# Patient Record
Sex: Female | Born: 1950 | Race: Asian | Hispanic: No | Marital: Married | Smoking: Never smoker
Health system: Southern US, Community
[De-identification: ages and names within clinical notes are randomized; demographics above are authoritative.]

## PROBLEM LIST (undated history)

## (undated) DIAGNOSIS — I10 Essential (primary) hypertension: Secondary | ICD-10-CM

## (undated) DIAGNOSIS — K219 Gastro-esophageal reflux disease without esophagitis: Secondary | ICD-10-CM

## (undated) DIAGNOSIS — E079 Disorder of thyroid, unspecified: Secondary | ICD-10-CM

## (undated) DIAGNOSIS — T884XXA Failed or difficult intubation, initial encounter: Secondary | ICD-10-CM

## (undated) DIAGNOSIS — Z972 Presence of dental prosthetic device (complete) (partial): Secondary | ICD-10-CM

## (undated) DIAGNOSIS — E119 Type 2 diabetes mellitus without complications: Secondary | ICD-10-CM

## (undated) DIAGNOSIS — M1990 Unspecified osteoarthritis, unspecified site: Secondary | ICD-10-CM

## (undated) DIAGNOSIS — E785 Hyperlipidemia, unspecified: Secondary | ICD-10-CM

## (undated) DIAGNOSIS — E039 Hypothyroidism, unspecified: Secondary | ICD-10-CM

## (undated) HISTORY — DX: Disorder of thyroid, unspecified: E07.9

## (undated) HISTORY — DX: Type 2 diabetes mellitus without complications: E11.9

## (undated) HISTORY — DX: Hyperlipidemia, unspecified: E78.5

## (undated) HISTORY — PX: NO PAST SURGERIES: SHX2092

---

## 2004-09-23 ENCOUNTER — Emergency Department: Payer: Self-pay | Admitting: Emergency Medicine

## 2006-12-26 ENCOUNTER — Ambulatory Visit: Payer: Self-pay | Admitting: Internal Medicine

## 2011-11-01 ENCOUNTER — Ambulatory Visit: Payer: Self-pay | Admitting: Internal Medicine

## 2012-12-24 ENCOUNTER — Ambulatory Visit: Payer: Self-pay | Admitting: Family

## 2016-10-23 ENCOUNTER — Other Ambulatory Visit: Payer: Self-pay | Admitting: Internal Medicine

## 2016-10-23 DIAGNOSIS — R109 Unspecified abdominal pain: Secondary | ICD-10-CM

## 2016-10-24 DIAGNOSIS — I10 Essential (primary) hypertension: Secondary | ICD-10-CM | POA: Diagnosis not present

## 2016-10-24 DIAGNOSIS — E034 Atrophy of thyroid (acquired): Secondary | ICD-10-CM | POA: Diagnosis not present

## 2016-10-24 DIAGNOSIS — R5381 Other malaise: Secondary | ICD-10-CM | POA: Diagnosis not present

## 2016-10-24 DIAGNOSIS — E784 Other hyperlipidemia: Secondary | ICD-10-CM | POA: Diagnosis not present

## 2016-10-30 ENCOUNTER — Encounter: Payer: Self-pay | Admitting: *Deleted

## 2016-10-30 ENCOUNTER — Ambulatory Visit
Admission: RE | Admit: 2016-10-30 | Discharge: 2016-10-30 | Disposition: A | Discharge status: Home / Self Care | Payer: Medicare Other | Source: Ambulatory Visit | Attending: Internal Medicine | Admitting: Internal Medicine

## 2016-10-30 DIAGNOSIS — R079 Chest pain, unspecified: Secondary | ICD-10-CM | POA: Diagnosis not present

## 2016-10-30 DIAGNOSIS — K8020 Calculus of gallbladder without cholecystitis without obstruction: Secondary | ICD-10-CM | POA: Insufficient documentation

## 2016-10-30 DIAGNOSIS — R109 Unspecified abdominal pain: Secondary | ICD-10-CM | POA: Insufficient documentation

## 2016-10-30 DIAGNOSIS — K760 Fatty (change of) liver, not elsewhere classified: Secondary | ICD-10-CM | POA: Insufficient documentation

## 2016-10-30 DIAGNOSIS — K8000 Calculus of gallbladder with acute cholecystitis without obstruction: Secondary | ICD-10-CM | POA: Diagnosis not present

## 2016-10-31 ENCOUNTER — Ambulatory Visit (INDEPENDENT_AMBULATORY_CARE_PROVIDER_SITE_OTHER): Payer: Medicare Other | Admitting: General Surgery

## 2016-10-31 ENCOUNTER — Encounter: Payer: Self-pay | Admitting: General Surgery

## 2016-10-31 VITALS — BP 144/66 | HR 62 | Resp 12 | Ht 60.0 in | Wt 133.0 lb

## 2016-10-31 DIAGNOSIS — K8020 Calculus of gallbladder without cholecystitis without obstruction: Secondary | ICD-10-CM

## 2016-10-31 HISTORY — DX: Calculus of gallbladder without cholecystitis without obstruction: K80.20

## 2016-10-31 NOTE — Progress Notes (Signed)
Patient ID: Sheena Parker, female   DOB: 1950/06/24, 66 y.o.   MRN: [id number]  Chief Complaint  Patient presents with  . Abdominal Pain    HPI Sheena Parker is a 66 y.o. female.  Here today for evaluation of her gall bladder referred by  Dr Lavera Guise She has been having right upper quadrant pain that comes and goes since last month. The pain seems to be getting worse over the past month. It is worse with fried foods and heavy spicy foods. She states she did have an episode at least 6 months ago. Abdominal ultrasound was 10-30-16.  She is here with her husband, [name].  HPI  Past Medical History:  Diagnosis Date  . Diabetes mellitus without complication ([place])   . Hyperlipidemia   . Thyroid disease     No past surgical history on file.  Family History  Problem Relation Age of Onset  . Colon cancer Neg Hx     Social History Social History  Substance Use Topics  . Smoking status: Never Smoker  . Smokeless tobacco: Never Used  . Alcohol use No    No Known Allergies  Current Outpatient Prescriptions  Medication Sig Dispense Refill  . acetaminophen (TYLENOL) 325 MG tablet Take 650 mg by mouth every 6 (six) hours as needed.    Marland Kitchen aspirin EC 81 MG tablet Take 81 mg by mouth daily.    . Atorvastatin Calcium (LIPITOR PO) Take by mouth daily.    Marland Kitchen levothyroxine (SYNTHROID, LEVOTHROID) 50 MCG tablet Take 50 mcg by mouth daily before breakfast.    . metFORMIN (GLUCOPHAGE) 500 MG tablet Take 500 mg by mouth 2 (two) times daily with a meal.     No current facility-administered medications for this visit.     Review of Systems Review of Systems  Constitutional: Negative.   Respiratory: Negative.   Cardiovascular: Negative.   Gastrointestinal: Positive for abdominal pain. Negative for nausea and vomiting.    Blood pressure (!) 144/66, pulse 62, resp. rate 12, height 5' (1.524 m), weight 133 lb (60.3 kg).  Physical Exam Physical Exam  Constitutional: She is oriented to  person, place, and time. She appears well-developed and well-nourished.  HENT:  Mouth/Throat: Oropharynx is clear and moist.  Eyes: Conjunctivae are normal. No scleral icterus.  Neck: Neck supple.  Cardiovascular: Normal rate, regular rhythm and normal heart sounds.   Pulmonary/Chest: Effort normal and breath sounds normal.  Abdominal: Soft. Bowel sounds are normal. There is no tenderness.    Lymphadenopathy:    She has no cervical adenopathy.  Neurological: She is alert and oriented to person, place, and time.  Skin: Skin is warm and dry.  Psychiatric: Her behavior is normal.    Data Reviewed Laboratory studies dated 10/24/2016 showed entirely normal liver function studies. Basic metabolic panel was unremarkable with exception of a nonfasting blood sugar 121. Creatinine 0.7. White blood cell count 9400 with hemoglobin of 13.3 with an MCV of 85. Platelet count 285,000. Normal amylase at 40. TSH 0.13. Electrocardiogram dated 05/10/2017 was unremarkable.  Abdominal ultrasound dated 10/30/2016 showed a gallstone measuring up to 1.4 cm. Prominent gallbladder sludge. Gallbladder wall thickening up to 4.9 mm. Trace amount of pericholecystic fluid. Negative sonographic Murphy sign. Common bile duct 7.4 mm.  Assessment    Chronic cholecystitis and cholelithiasis.    Plan    The patient's symptomatology is consistent with chronic cholecystitis and cholelithiasis. Presently asymptomatic when avoiding fatty foods.  Options for management were reviewed, based on ultrasound  finding I think she would be best served by cholecystectomy.    Laparoscopic Cholecystectomy with Intraoperative Cholangiogram. The procedure, including it's potential risks and complications (including but not limited to infection, bleeding, injury to intra-abdominal organs or bile ducts, bile leak, poor cosmetic result, sepsis and death) were discussed with the patient in detail. Non-operative options, including their  inherent risks (acute calculous cholecystitis with possible choledocholithiasis or gallstone pancreatitis, with the risk of ascending cholangitis, sepsis, and death) were discussed as well. The patient expressed and understanding of what we discussed and wishes to proceed with laparoscopic cholecystectomy. The patient further understands that if it is technically not possible, or it is unsafe to proceed laparoscopically, that I will convert to an open cholecystectomy.  HPI, Physical Exam, Assessment and Plan have been scribed under the direction and in the presence of Robert Bellow, MD.  Karie Fetch, RN  I have completed the exam and reviewed the above documentation for accuracy and completeness.  I agree with the above.  Haematologist has been used and any errors in dictation or transcription are unintentional.  Hervey Ard, M.D., F.A.C.S.  The patient is scheduled for surgery at [facility] on 11/14/16. She will pre admit by phone. The patient is aware of date and instructions.  Documented by Lesly Rubenstein LPN   Robert Bellow 10/31/2016, 8:58 PM

## 2016-10-31 NOTE — Patient Instructions (Addendum)

## 2016-11-03 ENCOUNTER — Encounter
Admission: RE | Admit: 2016-11-03 | Discharge: 2016-11-03 | Disposition: A | Discharge status: Home / Self Care | Payer: Medicare Other | Source: Ambulatory Visit | Attending: General Surgery | Admitting: General Surgery

## 2016-11-03 HISTORY — DX: Unspecified osteoarthritis, unspecified site: M19.90

## 2016-11-03 HISTORY — DX: Gastro-esophageal reflux disease without esophagitis: K21.9

## 2016-11-03 HISTORY — DX: Hypothyroidism, unspecified: E03.9

## 2016-11-03 HISTORY — DX: Essential (primary) hypertension: I10

## 2016-11-03 NOTE — Patient Instructions (Signed)
  Your procedure is scheduled on: 11-14-16 Report to Same Day Surgery 2nd floor medical mall [facility] Entrance-take elevator on left to 2nd floor.  Check in with surgery information desk.) To find out your arrival time please call [phone] between 1PM - 3PM on 11-13-16  Remember: Instructions that are not followed completely may result in serious medical risk, up to and including death, or upon the discretion of your surgeon and anesthesiologist your surgery may need to be rescheduled.    _x___ 1. Do not eat food or drink liquids after midnight. No gum chewing or  hard candies.     __x__ 2. No Alcohol for 24 hours before or after surgery.   __x__3. No Smoking for 24 prior to surgery.   ____  4. Bring all medications with you on the day of surgery if instructed.    __x__ 5. Notify your doctor if there is any change in your medical condition     (cold, fever, infections).     Do not wear jewelry, make-up, hairpins, clips or nail polish.  Do not wear lotions, powders, or perfumes. You may wear deodorant.  Do not shave 48 hours prior to surgery. Men may shave face and neck.  Do not bring valuables to the hospital.    [facility] is not responsible for any belongings or valuables.               Contacts, dentures or bridgework may not be worn into surgery.  Leave your suitcase in the car. After surgery it may be brought to your room.  For patients admitted to the hospital, discharge time is determined by your                       treatment team.   Patients discharged the day of surgery will not be allowed to drive home.  You will need someone to drive you home and stay with you the night of your procedure.    Please read over the following fact sheets that you were given:   [facility] Preparing for Surgery and or MRSA Information   _x___ Take anti-hypertensive (unless it includes a diuretic), cardiac, seizure, asthma,     anti-reflux and psychiatric medicines. These include:  1.  LISINOPRIL  2. LEVOTHYROXINE  3.  4.  5.  6.  ____Fleets enema or Magnesium Citrate as directed.   ____ Use CHG Soap or sage wipes as directed on instruction sheet   ____ Use inhalers on the day of surgery and bring to hospital day of surgery  _X___ Stop Metformin and Janumet 2 days prior to surgery-LAST DOSE OF METFORMIN ON Saturday, July 7    ____ Take 1/2 of usual insulin dose the night before surgery and none on the morning     surgery.   _x___ Follow recommendations from Cardiologist, Pulmonologist or PCP regarding          stopping Aspirin, Coumadin, Pllavix ,Eliquis, Effient, or Pradaxa, and Pletal-OK TO CONTINUE 81 MG ASA-DO NOT TAKE AM OF SURGERY  X____Stop Anti-inflammatories such as Advil, Aleve, Ibuprofen, Motrin, Naproxen, Naprosyn, Goodies powders or aspirin products NOW-OK to take Tyleno   ____ Stop supplements until after surgery.   ____ Bring C-Pap to the hospital.

## 2016-11-07 ENCOUNTER — Ambulatory Visit: Payer: Self-pay | Admitting: General Surgery

## 2016-11-13 MED ORDER — CEFAZOLIN SODIUM-DEXTROSE 2-4 GM/100ML-% IV SOLN
2.0000 g | INTRAVENOUS | Status: AC
  Administered 2016-11-14: 2 g via INTRAVENOUS

## 2016-11-14 ENCOUNTER — Ambulatory Visit: Payer: Medicare Other | Admitting: Anesthesiology

## 2016-11-14 ENCOUNTER — Encounter: Payer: Self-pay | Admitting: *Deleted

## 2016-11-14 ENCOUNTER — Ambulatory Visit
Admission: RE | Admit: 2016-11-14 | Discharge: 2016-11-14 | Disposition: A | Discharge status: Home / Self Care | Payer: Medicare Other | Source: Ambulatory Visit | Attending: General Surgery | Admitting: General Surgery

## 2016-11-14 ENCOUNTER — Ambulatory Visit: Payer: Medicare Other

## 2016-11-14 ENCOUNTER — Encounter
Admission: RE | Disposition: A | Discharge status: Home / Self Care | Payer: Self-pay | Source: Ambulatory Visit | Attending: General Surgery

## 2016-11-14 DIAGNOSIS — Z7982 Long term (current) use of aspirin: Secondary | ICD-10-CM | POA: Insufficient documentation

## 2016-11-14 DIAGNOSIS — K219 Gastro-esophageal reflux disease without esophagitis: Secondary | ICD-10-CM | POA: Insufficient documentation

## 2016-11-14 DIAGNOSIS — K8020 Calculus of gallbladder without cholecystitis without obstruction: Secondary | ICD-10-CM

## 2016-11-14 DIAGNOSIS — E119 Type 2 diabetes mellitus without complications: Secondary | ICD-10-CM | POA: Insufficient documentation

## 2016-11-14 DIAGNOSIS — K828 Other specified diseases of gallbladder: Secondary | ICD-10-CM | POA: Insufficient documentation

## 2016-11-14 DIAGNOSIS — E785 Hyperlipidemia, unspecified: Secondary | ICD-10-CM | POA: Insufficient documentation

## 2016-11-14 DIAGNOSIS — K8010 Calculus of gallbladder with chronic cholecystitis without obstruction: Secondary | ICD-10-CM | POA: Diagnosis not present

## 2016-11-14 DIAGNOSIS — E079 Disorder of thyroid, unspecified: Secondary | ICD-10-CM | POA: Insufficient documentation

## 2016-11-14 DIAGNOSIS — M1990 Unspecified osteoarthritis, unspecified site: Secondary | ICD-10-CM | POA: Insufficient documentation

## 2016-11-14 DIAGNOSIS — I10 Essential (primary) hypertension: Secondary | ICD-10-CM | POA: Diagnosis not present

## 2016-11-14 DIAGNOSIS — E039 Hypothyroidism, unspecified: Secondary | ICD-10-CM | POA: Diagnosis not present

## 2016-11-14 DIAGNOSIS — Z79899 Other long term (current) drug therapy: Secondary | ICD-10-CM | POA: Diagnosis not present

## 2016-11-14 DIAGNOSIS — K8044 Calculus of bile duct with chronic cholecystitis without obstruction: Secondary | ICD-10-CM | POA: Diagnosis not present

## 2016-11-14 HISTORY — PX: CHOLECYSTECTOMY: SHX55

## 2016-11-14 HISTORY — DX: Failed or difficult intubation, initial encounter: T88.4XXA

## 2016-11-14 LAB — GLUCOSE, CAPILLARY
GLUCOSE-CAPILLARY: 143 mg/dL — AB (ref 65–99)
Glucose-Capillary: 203 mg/dL — ABNORMAL HIGH (ref 65–99)

## 2016-11-14 SURGERY — LAPAROSCOPIC CHOLECYSTECTOMY WITH INTRAOPERATIVE CHOLANGIOGRAM
Anesthesia: General | Wound class: Clean Contaminated

## 2016-11-14 MED ORDER — DEXAMETHASONE SODIUM PHOSPHATE 10 MG/ML IJ SOLN
INTRAMUSCULAR | Status: DC | PRN
  Administered 2016-11-14: 10 mg via INTRAVENOUS

## 2016-11-14 MED ORDER — GLYCOPYRROLATE 0.2 MG/ML IJ SOLN
INTRAMUSCULAR | Status: AC
  Filled 2016-11-14: qty 1

## 2016-11-14 MED ORDER — LIDOCAINE HCL (PF) 2 % IJ SOLN
INTRAMUSCULAR | Status: AC
  Filled 2016-11-14: qty 2

## 2016-11-14 MED ORDER — FAMOTIDINE 20 MG PO TABS
20.0000 mg | ORAL_TABLET | Freq: Once | ORAL | Status: AC
  Administered 2016-11-14: 20 mg via ORAL

## 2016-11-14 MED ORDER — SODIUM CHLORIDE 0.9 % IJ SOLN
INTRAMUSCULAR | Status: AC
  Filled 2016-11-14: qty 50

## 2016-11-14 MED ORDER — LIDOCAINE HCL (CARDIAC) 20 MG/ML IV SOLN
INTRAVENOUS | Status: DC | PRN
  Administered 2016-11-14: 80 mg via INTRAVENOUS

## 2016-11-14 MED ORDER — HYDROCODONE-ACETAMINOPHEN 5-325 MG PO TABS: 1.0000 | ORAL_TABLET | ORAL | 0 refills | Status: DC | PRN

## 2016-11-14 MED ORDER — ONDANSETRON HCL 4 MG/2ML IJ SOLN
INTRAMUSCULAR | Status: DC | PRN
  Administered 2016-11-14: 4 mg via INTRAVENOUS

## 2016-11-14 MED ORDER — FENTANYL CITRATE (PF) 100 MCG/2ML IJ SOLN
INTRAMUSCULAR | Status: DC | PRN
  Administered 2016-11-14: 25 ug via INTRAVENOUS
  Administered 2016-11-14 (×2): 50 ug via INTRAVENOUS

## 2016-11-14 MED ORDER — FENTANYL CITRATE (PF) 100 MCG/2ML IJ SOLN
INTRAMUSCULAR | Status: AC
  Filled 2016-11-14: qty 2

## 2016-11-14 MED ORDER — SUCCINYLCHOLINE CHLORIDE 20 MG/ML IJ SOLN
INTRAMUSCULAR | Status: AC
  Filled 2016-11-14: qty 1

## 2016-11-14 MED ORDER — PHENYLEPHRINE HCL 10 MG/ML IJ SOLN
INTRAMUSCULAR | Status: AC
  Filled 2016-11-14: qty 1

## 2016-11-14 MED ORDER — ONDANSETRON HCL 4 MG/2ML IJ SOLN
INTRAMUSCULAR | Status: AC
  Filled 2016-11-14: qty 2

## 2016-11-14 MED ORDER — EPHEDRINE SULFATE 50 MG/ML IJ SOLN
INTRAMUSCULAR | Status: AC
  Filled 2016-11-14: qty 1

## 2016-11-14 MED ORDER — SODIUM CHLORIDE 0.9 % IV SOLN
INTRAVENOUS | Status: DC | PRN
  Administered 2016-11-14: 19 mL

## 2016-11-14 MED ORDER — SUGAMMADEX SODIUM 200 MG/2ML IV SOLN
INTRAVENOUS | Status: DC | PRN
  Administered 2016-11-14: 125 mg via INTRAVENOUS

## 2016-11-14 MED ORDER — SODIUM CHLORIDE 0.9 % IV SOLN
INTRAVENOUS | Status: DC
  Administered 2016-11-14: 07:00:00 via INTRAVENOUS

## 2016-11-14 MED ORDER — OXYCODONE HCL 5 MG PO TABS: 5.0000 mg | ORAL_TABLET | Freq: Once | ORAL | Status: DC | PRN

## 2016-11-14 MED ORDER — KETOROLAC TROMETHAMINE 30 MG/ML IJ SOLN
INTRAMUSCULAR | Status: DC | PRN
  Administered 2016-11-14: 30 mg via INTRAVENOUS

## 2016-11-14 MED ORDER — OXYCODONE HCL 5 MG/5ML PO SOLN: 5.0000 mg | Freq: Once | ORAL | Status: DC | PRN

## 2016-11-14 MED ORDER — MIDAZOLAM HCL 2 MG/2ML IJ SOLN
INTRAMUSCULAR | Status: DC | PRN
  Administered 2016-11-14: 2 mg via INTRAVENOUS

## 2016-11-14 MED ORDER — FENTANYL CITRATE (PF) 100 MCG/2ML IJ SOLN: 25.0000 ug | INTRAMUSCULAR | Status: DC | PRN

## 2016-11-14 MED ORDER — ACETAMINOPHEN 10 MG/ML IV SOLN
INTRAVENOUS | Status: DC | PRN
  Administered 2016-11-14: 1000 mg via INTRAVENOUS

## 2016-11-14 MED ORDER — DEXAMETHASONE SODIUM PHOSPHATE 10 MG/ML IJ SOLN
INTRAMUSCULAR | Status: AC
  Filled 2016-11-14: qty 1

## 2016-11-14 MED ORDER — HYDROCODONE-ACETAMINOPHEN 5-325 MG PO TABS
1.0000 | ORAL_TABLET | ORAL | Status: DC | PRN
  Administered 2016-11-14: 1 via ORAL

## 2016-11-14 MED ORDER — KETOROLAC TROMETHAMINE 30 MG/ML IJ SOLN
INTRAMUSCULAR | Status: AC
Start: 2016-11-14 — End: 2016-11-14
  Filled 2016-11-14: qty 1

## 2016-11-14 MED ORDER — ROCURONIUM BROMIDE 50 MG/5ML IV SOLN
INTRAVENOUS | Status: AC
  Filled 2016-11-14: qty 1

## 2016-11-14 MED ORDER — EPHEDRINE SULFATE 50 MG/ML IJ SOLN
INTRAMUSCULAR | Status: DC | PRN
  Administered 2016-11-14: 10 mg via INTRAVENOUS

## 2016-11-14 MED ORDER — HYDROCODONE-ACETAMINOPHEN 5-325 MG PO TABS
ORAL_TABLET | ORAL | Status: AC
  Filled 2016-11-14: qty 1

## 2016-11-14 MED ORDER — PROPOFOL 10 MG/ML IV BOLUS
INTRAVENOUS | Status: DC | PRN
  Administered 2016-11-14: 130 mg via INTRAVENOUS

## 2016-11-14 MED ORDER — CEFAZOLIN SODIUM-DEXTROSE 2-4 GM/100ML-% IV SOLN
INTRAVENOUS | Status: AC
  Filled 2016-11-14: qty 100

## 2016-11-14 MED ORDER — PROPOFOL 10 MG/ML IV BOLUS
INTRAVENOUS | Status: AC
  Filled 2016-11-14: qty 40

## 2016-11-14 MED ORDER — ROCURONIUM BROMIDE 100 MG/10ML IV SOLN
INTRAVENOUS | Status: DC | PRN
  Administered 2016-11-14: 40 mg via INTRAVENOUS

## 2016-11-14 MED ORDER — ACETAMINOPHEN 10 MG/ML IV SOLN
INTRAVENOUS | Status: AC
  Filled 2016-11-14: qty 100

## 2016-11-14 MED ORDER — MIDAZOLAM HCL 2 MG/2ML IJ SOLN
INTRAMUSCULAR | Status: AC
  Filled 2016-11-14: qty 2

## 2016-11-14 MED ORDER — FAMOTIDINE 20 MG PO TABS
ORAL_TABLET | ORAL | Status: AC
  Administered 2016-11-14: 20 mg via ORAL
  Filled 2016-11-14: qty 1

## 2016-11-14 SURGICAL SUPPLY — 40 items
APPLIER CLIP ROT 10 11.4 M/L (STAPLE) ×2
BLADE SURG 11 STRL SS SAFETY (MISCELLANEOUS) ×2 IMPLANT
CANISTER SUCT 1200ML W/VALVE (MISCELLANEOUS) ×2 IMPLANT
CANNULA DILATOR 10 W/SLV (CANNULA) ×2 IMPLANT
CANNULA DILATOR 5 W/SLV (CANNULA) ×4 IMPLANT
CATH CHOLANG 76X19 KUMAR (CATHETERS) ×2 IMPLANT
CHLORAPREP W/TINT 26ML (MISCELLANEOUS) ×2 IMPLANT
CLIP APPLIE ROT 10 11.4 M/L (STAPLE) ×1 IMPLANT
CONRAY 60ML FOR OR (MISCELLANEOUS) ×2 IMPLANT
DEVICE PMI PUNCTURE CLOSURE (MISCELLANEOUS) ×2 IMPLANT
DISSECTOR KITTNER STICK (MISCELLANEOUS) ×1 IMPLANT
DISSECTORS/KITTNER STICK (MISCELLANEOUS) ×2
DRAPE SHEET LG 3/4 BI-LAMINATE (DRAPES) ×2 IMPLANT
DRSG TEGADERM 2-3/8X2-3/4 SM (GAUZE/BANDAGES/DRESSINGS) ×8 IMPLANT
DRSG TELFA 4X3 1S NADH ST (GAUZE/BANDAGES/DRESSINGS) ×2 IMPLANT
ELECT REM PT RETURN 9FT ADLT (ELECTROSURGICAL) ×2
ELECTRODE REM PT RTRN 9FT ADLT (ELECTROSURGICAL) ×1 IMPLANT
GLOVE BIO SURGEON STRL SZ7.5 (GLOVE) ×8 IMPLANT
GLOVE INDICATOR 8.0 STRL GRN (GLOVE) ×6 IMPLANT
GOWN STRL REUS W/ TWL LRG LVL3 (GOWN DISPOSABLE) ×3 IMPLANT
GOWN STRL REUS W/TWL LRG LVL3 (GOWN DISPOSABLE) ×3
IRRIGATION STRYKERFLOW (MISCELLANEOUS) ×1 IMPLANT
IRRIGATOR STRYKERFLOW (MISCELLANEOUS) ×2
IV LACTATED RINGERS 1000ML (IV SOLUTION) ×2 IMPLANT
KIT RM TURNOVER STRD PROC AR (KITS) ×2 IMPLANT
LABEL OR SOLS (LABEL) ×2 IMPLANT
NDL INSUFF ACCESS 14 VERSASTEP (NEEDLE) ×2 IMPLANT
NS IRRIG 500ML POUR BTL (IV SOLUTION) ×2 IMPLANT
PACK LAP CHOLECYSTECTOMY (MISCELLANEOUS) ×2 IMPLANT
POUCH SPECIMEN RETRIEVAL 10MM (ENDOMECHANICALS) ×2 IMPLANT
SCISSORS METZENBAUM CVD 33 (INSTRUMENTS) ×2 IMPLANT
SEAL FOR SCOPE WARMER C3101 (MISCELLANEOUS) IMPLANT
STRIP CLOSURE SKIN 1/2X4 (GAUZE/BANDAGES/DRESSINGS) ×2 IMPLANT
SUT MAXON ABS #0 GS21 30IN (SUTURE) ×2 IMPLANT
SUT VIC AB 0 CT2 27 (SUTURE) ×2 IMPLANT
SUT VIC AB 4-0 FS2 27 (SUTURE) ×2 IMPLANT
SWABSTK COMLB BENZOIN TINCTURE (MISCELLANEOUS) ×2 IMPLANT
TROCAR XCEL NON-BLD 11X100MML (ENDOMECHANICALS) ×2 IMPLANT
TUBING INSUFFLATOR HI FLOW (MISCELLANEOUS) ×2 IMPLANT
WATER STERILE IRR 1000ML POUR (IV SOLUTION) ×2 IMPLANT

## 2016-11-14 NOTE — Transfer of Care (Signed)
Immediate Anesthesia Transfer of Care Note  Patient: Sheena Parker  Procedure(s) Performed: Procedure(s): LAPAROSCOPIC CHOLECYSTECTOMY WITH INTRAOPERATIVE CHOLANGIOGRAM (N/A)  Patient Location: PACU  Anesthesia Type:General  Level of Consciousness: sedated  Airway & Oxygen Therapy: Patient Spontanous Breathing and Patient connected to face mask oxygen  Post-op Assessment: Report given to RN and Post -op Vital signs reviewed and stable  Post vital signs: Reviewed and stable  Last Vitals:  Vitals:   11/14/16 0608 11/14/16 0907  BP: (!) 166/76 140/62  Pulse: (!) 55 (!) 59  Resp: 15 13  Temp: (!) 35.7 C (!) 18.5 C    Complications: No apparent anesthesia complications

## 2016-11-14 NOTE — Op Note (Signed)
Preoperative diagnosis: Chronic cholecystitis and cholelithiasis.  Postoperative diagnosis: Same.  Operative procedure: Laparoscopic cholecystectomy with intraoperative cholangiograms.  Operating surgeon: Ollen Bowl, M.D.  Anesthesia: Gen. endotracheal.  Estimated blood loss: Less than 5 mL.  Of note: This 66 year old woman has had episodic abdominal pain and ultrasound showed evidence of cholelithiasis as well as a thickened gallbladder wall and pericolic cystic fluid on 67/20/9470. She was admitted today for elective cholecystectomy. She received Kefzol prior to the procedure. SCD stockings were used for DVT prevention.  Operative note: The patient underwent general endotracheal anesthesia without difficulty. The abdomen was prepped with ChloraPrep and draped. An Trendelenburg positioning varies needle was placed with trans-umbilical incision. After assuring intra-abdominal location with the hanging drop test a 10 mm Step port was expanded. Inspection showed no evidence of injury from initial port placement. The patient was placed in the reverse Trendelenburg position and rolled to the left. An 11 mm XL port was placed under direct vision in the epigastrium and 2-5 mm Port replaced on the right lateral abdominal wall. There was noted to be moderate thickening the gallbladder without evidence of acute inflammation. No pericholecystic fluid. A few filmy adhesions between the omentum and the undersurface of the gallbladder as well as the cholecystoduodenal ligament were taken down with cautery dissection. There was moderate edema near the neck of the gallbladder. The cystic duct was isolated and fluoroscopic cholangiograms completed after placement of a Kumar clamp. 19 mL of one half strength Conray 60 was utilized for cholangiograms. This showed a relatively short cystic duct and prompt reflux in the both right and left hepatic ducts. Moderate ductal dilatation was noted with smooth tapering  distally. The patient was rolled to the right for final visualization of the distal common bile duct and it was no clear-cut stone defect.  The cystic duct and branches of the cystic artery were doubly clipped and divided. The gallbladder is removed from the liver bed making super cautery dissection. Due to the multiple large stones in 6 slight identified on ultrasound the gallbladder is placed into an Endo Catch bag and delivered to the umbilical port site. The umbilical incision was opened about a centimeter and the gallbladder and stones extracted. After reestablishing pneumoperitoneum inspection of the right upper quadrant showed good hemostasis. The area was irrigated with lactated Ringer solution. Inspection again of the lower abdomen showed no evidence of injury from initial port placement. There was noted to be a loop of small bowel adherent in the right pelvis as well as a small area of omentum stuck to the anterior bowel wall. The defect at the umbilicus was closed with a 0 Maxon suture 2. The abdomen was then desufflated under direct vision. Skin incisions were closed with 4-0 Vicryl septic suture. Benzoin, Steri-Strips, Telfa and Tegaderm dressings were applied.  The patient tolerated the procedure well was taken to recovery room in stable condition.

## 2016-11-14 NOTE — Anesthesia Procedure Notes (Signed)
Procedure Name: Intubation Date/Time: 11/14/2016 7:37 AM Performed by: Doreen Salvage Pre-anesthesia Checklist: Patient identified, Patient being monitored, Timeout performed, Emergency Drugs available and Suction available Patient Re-evaluated:Patient Re-evaluated prior to inductionOxygen Delivery Method: Circle system utilized Preoxygenation: Pre-oxygenation with 100% oxygen Intubation Type: IV induction Ventilation: Mask ventilation without difficulty Laryngoscope Size: Mac, 3 and McGraph Grade View: Grade I Tube type: Oral Tube size: 7.0 mm Number of attempts: 1 Airway Equipment and Method: Stylet Placement Confirmation: ETT inserted through vocal cords under direct vision,  positive ETCO2 and breath sounds checked- equal and bilateral Secured at: 21 cm Tube secured with: Tape Dental Injury: Teeth and Oropharynx as per pre-operative assessment  Difficulty Due To: Difficulty was anticipated, Difficult Airway- due to dentition, Difficult Airway- due to anterior larynx and Difficult Airway- due to limited oral opening

## 2016-11-14 NOTE — Anesthesia Post-op Follow-up Note (Cosign Needed)
Anesthesia QCDR form completed.        

## 2016-11-14 NOTE — Anesthesia Postprocedure Evaluation (Signed)
Anesthesia Post Note  Patient: Levenia Tolbert  Procedure(s) Performed: Procedure(s) (LRB): LAPAROSCOPIC CHOLECYSTECTOMY WITH INTRAOPERATIVE CHOLANGIOGRAM (N/A)  Patient location during evaluation: PACU Anesthesia Type: General Level of consciousness: awake and alert Pain management: pain level controlled Vital Signs Assessment: post-procedure vital signs reviewed and stable Respiratory status: spontaneous breathing, nonlabored ventilation, respiratory function stable and patient connected to nasal cannula oxygen Cardiovascular status: blood pressure returned to baseline and stable Postop Assessment: no signs of nausea or vomiting Anesthetic complications: no     Last Vitals:  Vitals:   11/14/16 0938 11/14/16 0957  BP: (!) 152/68 (!) 174/67  Pulse: 62 (!) 59  Resp: 18 16  Temp:  (!) 36.3 C    Last Pain:  Vitals:   11/14/16 0957  TempSrc: Temporal  PainSc: 4                  Precious Haws Xoe Hoe

## 2016-11-14 NOTE — Anesthesia Preprocedure Evaluation (Signed)
Anesthesia Evaluation  Patient identified by MRN, date of birth, ID band Patient awake    Reviewed: Allergy & Precautions, H&P , NPO status , Patient's Chart, lab work & pertinent test results  History of Anesthesia Complications Negative for: history of anesthetic complications  Airway Mallampati: III  TM Distance: <3 FB Neck ROM: limited    Dental  (+) Poor Dentition, Chipped, Implants, Missing   Pulmonary neg pulmonary ROS, neg shortness of breath,           Cardiovascular Exercise Tolerance: Good hypertension, (-) angina(-) Past MI and (-) DOE      Neuro/Psych negative neurological ROS  negative psych ROS   GI/Hepatic Neg liver ROS, GERD  Medicated and Controlled,  Endo/Other  diabetes, Type 2, Oral Hypoglycemic AgentsHypothyroidism   Renal/GU      Musculoskeletal  (+) Arthritis ,   Abdominal   Peds  Hematology negative hematology ROS (+)   Anesthesia Other Findings Past Medical History: No date: Arthritis     Comment: KNEE No date: Diabetes mellitus without complication ([facility]) No date: GERD (gastroesophageal reflux disease)     Comment: NO MEDS No date: Hyperlipidemia No date: Hypertension No date: Hypothyroidism No date: Thyroid disease  Past Surgical History: No date: NO PAST SURGERIES  BMI    Body Mass Index:  25.97 kg/m      Reproductive/Obstetrics negative OB ROS                             Anesthesia Physical Anesthesia Plan  ASA: III  Anesthesia Plan: General ETT   Post-op Pain Management:    Induction: Intravenous  PONV Risk Score and Plan: 4 or greater and Ondansetron, Dexamethasone, Propofol and Midazolam  Airway Management Planned: Oral ETT  Additional Equipment:   Intra-op Plan:   Post-operative Plan: Extubation in OR  Informed Consent: I have reviewed the patients History and Physical, chart, labs and discussed the procedure including the  risks, benefits and alternatives for the proposed anesthesia with the patient or authorized representative who has indicated his/her understanding and acceptance.   Dental Advisory Given  Plan Discussed with: Anesthesiologist, CRNA and Surgeon  Anesthesia Plan Comments: (Patient consented for risks of anesthesia including but not limited to:  - adverse reactions to medications - damage to teeth, lips or other oral mucosa - sore throat or hoarseness - Damage to heart, brain, lungs or loss of life  Patient voiced understanding.)        Anesthesia Quick Evaluation

## 2016-11-14 NOTE — OR Nursing (Signed)
[place] dr. Bary Castilla visited.  ok'd dc home when ready

## 2016-11-14 NOTE — H&P (Signed)
Symptom free since last weeks exam. For removal of gallbladder.

## 2016-11-14 NOTE — Discharge Instructions (Signed)

## 2016-11-15 LAB — SURGICAL PATHOLOGY

## 2016-11-30 ENCOUNTER — Ambulatory Visit (INDEPENDENT_AMBULATORY_CARE_PROVIDER_SITE_OTHER): Payer: Medicare Other | Admitting: General Surgery

## 2016-11-30 VITALS — BP 128/72 | HR 70 | Resp 12 | Ht 60.0 in | Wt 133.0 lb

## 2016-11-30 DIAGNOSIS — K8020 Calculus of gallbladder without cholecystitis without obstruction: Secondary | ICD-10-CM

## 2016-11-30 NOTE — Patient Instructions (Signed)
Patient to return as needed. The patient is aware to call back for any questions or concerns. 

## 2016-11-30 NOTE — Progress Notes (Signed)
Patient ID: Sheena Parker, female   DOB: 04/03/1951, 66 y.o.   MRN: [id number]  Chief Complaint  Patient presents with  . Routine Post Op    HPI Sheena Parker is a 66 y.o. female here today for her post op gallbladder surgery done 11/14/2016.Patient states she is doing well. No dietary intolerance or change in bowel habits. HPI  Past Medical History:  Diagnosis Date  . Arthritis    KNEE  . Diabetes mellitus without complication ([place])   . Difficult intubation   . GERD (gastroesophageal reflux disease)    NO MEDS  . Hyperlipidemia   . Hypertension   . Hypothyroidism   . Thyroid disease     Past Surgical History:  Procedure Laterality Date  . CHOLECYSTECTOMY N/A 11/14/2016   Procedure: LAPAROSCOPIC CHOLECYSTECTOMY WITH INTRAOPERATIVE CHOLANGIOGRAM;  Surgeon: Robert Bellow, MD;  Location: [facility] ORS;  Service: General;  Laterality: N/A;  . NO PAST SURGERIES      Family History  Problem Relation Age of Onset  . Colon cancer Neg Hx     Social History Social History  Substance Use Topics  . Smoking status: Never Smoker  . Smokeless tobacco: Never Used  . Alcohol use No    No Known Allergies  Current Outpatient Prescriptions  Medication Sig Dispense Refill  . acetaminophen (TYLENOL) 325 MG tablet Take 650 mg by mouth every 6 (six) hours as needed for mild pain or headache.     Marland Kitchen aspirin EC 81 MG tablet Take 81 mg by mouth daily.    Marland Kitchen HYDROcodone-acetaminophen (NORCO) 5-325 MG tablet Take 1-2 tablets by mouth every 4 (four) hours as needed for moderate pain. 30 tablet 0  . levothyroxine (SYNTHROID, LEVOTHROID) 50 MCG tablet Take 50 mcg by mouth daily before breakfast.    . lisinopril (PRINIVIL,ZESTRIL) 10 MG tablet Take 10 mg by mouth every morning.     . metFORMIN (GLUCOPHAGE) 500 MG tablet Take 500 mg by mouth 2 (two) times daily with a meal.     No current facility-administered medications for this visit.     Review of Systems Review of Systems  Constitutional:  Negative.   Respiratory: Negative.   Cardiovascular: Negative.     Blood pressure 128/72, pulse 70, resp. rate 12, height 5' (1.524 m), weight 133 lb (60.3 kg).  Physical Exam Physical Exam  Constitutional: She is oriented to person, place, and time. She appears well-developed and well-nourished.  Abdominal: Soft. Normal appearance and bowel sounds are normal. There is no hepatomegaly. There is no tenderness.  Port sites are clean and healing well.   Neurological: She is alert and oriented to person, place, and time.  Skin: Skin is warm and dry.    Data Reviewed Pathology:  A. GALLBLADDER; CHOLECYSTECTOMY:  - CHOLELITHIASIS AND CHRONIC CHOLECYSTITIS.  - BENIGN CYSTIC DUCT LYMPH NODE.   Assessment    Doing well status post cholecystectomy.    Plan        Patient to return as needed. The patient is aware to call back for any questions or concerns.   HPI, Physical Exam, Assessment and Plan have been scribed under the direction and in the presence of Hervey Ard, MD.  Gaspar Cola, CMA  I have completed the exam and reviewed the above documentation for accuracy and completeness.  I agree with the above.  Haematologist has been used and any errors in dictation or transcription are unintentional.  Hervey Ard, M.D., F.A.C.S.  Robert Bellow 12/01/2016, 7:30 PM

## 2017-07-17 DIAGNOSIS — I10 Essential (primary) hypertension: Secondary | ICD-10-CM | POA: Diagnosis not present

## 2017-07-17 DIAGNOSIS — E1165 Type 2 diabetes mellitus with hyperglycemia: Secondary | ICD-10-CM | POA: Diagnosis not present

## 2017-07-17 DIAGNOSIS — Z7984 Long term (current) use of oral hypoglycemic drugs: Secondary | ICD-10-CM | POA: Diagnosis not present

## 2017-07-17 DIAGNOSIS — Z803 Family history of malignant neoplasm of breast: Secondary | ICD-10-CM | POA: Diagnosis not present

## 2017-07-17 DIAGNOSIS — G3184 Mild cognitive impairment, so stated: Secondary | ICD-10-CM | POA: Diagnosis not present

## 2017-07-17 DIAGNOSIS — E663 Overweight: Secondary | ICD-10-CM | POA: Diagnosis not present

## 2017-07-17 DIAGNOSIS — Z6826 Body mass index (BMI) 26.0-26.9, adult: Secondary | ICD-10-CM | POA: Diagnosis not present

## 2017-07-17 DIAGNOSIS — E039 Hypothyroidism, unspecified: Secondary | ICD-10-CM | POA: Diagnosis not present

## 2017-07-17 DIAGNOSIS — K08409 Partial loss of teeth, unspecified cause, unspecified class: Secondary | ICD-10-CM | POA: Diagnosis not present

## 2017-07-17 DIAGNOSIS — Z7982 Long term (current) use of aspirin: Secondary | ICD-10-CM | POA: Diagnosis not present

## 2017-08-13 ENCOUNTER — Other Ambulatory Visit: Payer: Self-pay | Admitting: Internal Medicine

## 2017-08-13 DIAGNOSIS — Z1231 Encounter for screening mammogram for malignant neoplasm of breast: Secondary | ICD-10-CM

## 2017-08-15 ENCOUNTER — Ambulatory Visit
Admission: RE | Admit: 2017-08-15 | Discharge: 2017-08-15 | Disposition: A | Discharge status: Home / Self Care | Payer: Medicare HMO | Source: Ambulatory Visit | Attending: Internal Medicine | Admitting: Internal Medicine

## 2017-08-15 DIAGNOSIS — Z1231 Encounter for screening mammogram for malignant neoplasm of breast: Secondary | ICD-10-CM

## 2018-05-24 DIAGNOSIS — E669 Obesity, unspecified: Secondary | ICD-10-CM | POA: Diagnosis not present

## 2018-05-24 DIAGNOSIS — I10 Essential (primary) hypertension: Secondary | ICD-10-CM | POA: Diagnosis not present

## 2018-05-24 DIAGNOSIS — E109 Type 1 diabetes mellitus without complications: Secondary | ICD-10-CM | POA: Diagnosis not present

## 2018-05-24 DIAGNOSIS — E119 Type 2 diabetes mellitus without complications: Secondary | ICD-10-CM | POA: Diagnosis not present

## 2018-05-24 DIAGNOSIS — E034 Atrophy of thyroid (acquired): Secondary | ICD-10-CM | POA: Diagnosis not present

## 2018-05-24 DIAGNOSIS — R5381 Other malaise: Secondary | ICD-10-CM | POA: Diagnosis not present

## 2018-05-25 DIAGNOSIS — R69 Illness, unspecified: Secondary | ICD-10-CM | POA: Diagnosis not present

## 2019-01-01 DIAGNOSIS — H2513 Age-related nuclear cataract, bilateral: Secondary | ICD-10-CM | POA: Diagnosis not present

## 2019-02-04 DIAGNOSIS — R69 Illness, unspecified: Secondary | ICD-10-CM | POA: Diagnosis not present

## 2019-02-10 DIAGNOSIS — H2513 Age-related nuclear cataract, bilateral: Secondary | ICD-10-CM | POA: Diagnosis not present

## 2019-02-20 DIAGNOSIS — E109 Type 1 diabetes mellitus without complications: Secondary | ICD-10-CM | POA: Diagnosis not present

## 2019-02-20 DIAGNOSIS — H269 Unspecified cataract: Secondary | ICD-10-CM | POA: Diagnosis not present

## 2019-02-20 DIAGNOSIS — I10 Essential (primary) hypertension: Secondary | ICD-10-CM | POA: Diagnosis not present

## 2019-02-20 DIAGNOSIS — Z23 Encounter for immunization: Secondary | ICD-10-CM | POA: Diagnosis not present

## 2019-02-20 DIAGNOSIS — E119 Type 2 diabetes mellitus without complications: Secondary | ICD-10-CM | POA: Diagnosis not present

## 2019-02-21 ENCOUNTER — Other Ambulatory Visit: Payer: Self-pay | Admitting: Internal Medicine

## 2019-02-21 DIAGNOSIS — Z1231 Encounter for screening mammogram for malignant neoplasm of breast: Secondary | ICD-10-CM

## 2019-02-25 ENCOUNTER — Telehealth: Payer: Self-pay

## 2019-02-25 ENCOUNTER — Other Ambulatory Visit: Payer: Self-pay

## 2019-02-25 DIAGNOSIS — Z1211 Encounter for screening for malignant neoplasm of colon: Secondary | ICD-10-CM

## 2019-02-25 NOTE — Telephone Encounter (Signed)
Gastroenterology Pre-Procedure Review  Request Date: Monday 03/17/19 Requesting Physician: Dr. Bonna Gains  PATIENT REVIEW QUESTIONS: The patient responded to the following health history questions as indicated:    1. Are you having any GI issues? no 2. Do you have a personal history of Polyps? no 3. Do you have a family history of Colon Cancer or Polyps? no 4. Diabetes Mellitus? no 5. Joint replacements in the past 12 months?no 6. Major health problems in the past 3 months?no 7. Any artificial heart valves, MVP, or defibrillator?no    MEDICATIONS & ALLERGIES:    Patient reports the following regarding taking any anticoagulation/antiplatelet therapy:   Plavix, Coumadin, Eliquis, Xarelto, Lovenox, Pradaxa, Brilinta, or Effient? no Aspirin? yes (81 mg daily)  Patient confirms/reports the following medications:  Current Outpatient Medications  Medication Sig Dispense Refill  . acetaminophen (TYLENOL) 325 MG tablet Take 650 mg by mouth every 6 (six) hours as needed for mild pain or headache.     Marland Kitchen aspirin EC 81 MG tablet Take 81 mg by mouth daily.    Marland Kitchen HYDROcodone-acetaminophen (NORCO) 5-325 MG tablet Take 1-2 tablets by mouth every 4 (four) hours as needed for moderate pain. 30 tablet 0  . levothyroxine (SYNTHROID, LEVOTHROID) 50 MCG tablet Take 50 mcg by mouth daily before breakfast.    . lisinopril (PRINIVIL,ZESTRIL) 10 MG tablet Take 10 mg by mouth every morning.     . metFORMIN (GLUCOPHAGE) 500 MG tablet Take 500 mg by mouth 2 (two) times daily with a meal.     No current facility-administered medications for this visit.     Patient confirms/reports the following allergies:  No Known Allergies  No orders of the defined types were placed in this encounter.   AUTHORIZATION INFORMATION Primary Insurance: 1D#: Group #:  Secondary Insurance: 1D#: Group #:  SCHEDULE INFORMATION: Date: 03/17/19 Time: Location:[facility]

## 2019-03-12 ENCOUNTER — Telehealth: Payer: Self-pay

## 2019-03-12 NOTE — Telephone Encounter (Signed)
Called patient to confirm patient procedure appointment and to remind patient that they have to go for a COVID test on 03/13/2019.  Patient verbalized understanding

## 2019-03-13 ENCOUNTER — Other Ambulatory Visit
Admission: RE | Admit: 2019-03-13 | Discharge: 2019-03-13 | Disposition: A | Discharge status: Home / Self Care | Payer: Medicare HMO | Source: Ambulatory Visit | Attending: Gastroenterology | Admitting: Gastroenterology

## 2019-03-13 ENCOUNTER — Other Ambulatory Visit: Payer: Self-pay

## 2019-03-13 DIAGNOSIS — Z20828 Contact with and (suspected) exposure to other viral communicable diseases: Secondary | ICD-10-CM | POA: Insufficient documentation

## 2019-03-13 DIAGNOSIS — Z01812 Encounter for preprocedural laboratory examination: Secondary | ICD-10-CM | POA: Insufficient documentation

## 2019-03-13 LAB — SARS CORONAVIRUS 2 (TAT 6-24 HRS): SARS Coronavirus 2: NEGATIVE

## 2019-03-14 ENCOUNTER — Encounter: Payer: Self-pay | Admitting: *Deleted

## 2019-03-17 ENCOUNTER — Ambulatory Visit: Payer: Medicare HMO | Admitting: Anesthesiology

## 2019-03-17 ENCOUNTER — Ambulatory Visit
Admission: RE | Admit: 2019-03-17 | Discharge: 2019-03-17 | Disposition: A | Discharge status: Home / Self Care | Payer: Medicare HMO | Attending: Gastroenterology | Admitting: Gastroenterology

## 2019-03-17 ENCOUNTER — Encounter: Payer: Self-pay | Admitting: *Deleted

## 2019-03-17 ENCOUNTER — Encounter
Admission: RE | Disposition: A | Discharge status: Home / Self Care | Payer: Self-pay | Source: Home / Self Care | Attending: Gastroenterology

## 2019-03-17 DIAGNOSIS — Z803 Family history of malignant neoplasm of breast: Secondary | ICD-10-CM | POA: Diagnosis not present

## 2019-03-17 DIAGNOSIS — I10 Essential (primary) hypertension: Secondary | ICD-10-CM | POA: Insufficient documentation

## 2019-03-17 DIAGNOSIS — E785 Hyperlipidemia, unspecified: Secondary | ICD-10-CM | POA: Insufficient documentation

## 2019-03-17 DIAGNOSIS — E039 Hypothyroidism, unspecified: Secondary | ICD-10-CM | POA: Insufficient documentation

## 2019-03-17 DIAGNOSIS — Z90710 Acquired absence of both cervix and uterus: Secondary | ICD-10-CM | POA: Insufficient documentation

## 2019-03-17 DIAGNOSIS — Z7982 Long term (current) use of aspirin: Secondary | ICD-10-CM | POA: Diagnosis not present

## 2019-03-17 DIAGNOSIS — Z1211 Encounter for screening for malignant neoplasm of colon: Secondary | ICD-10-CM | POA: Insufficient documentation

## 2019-03-17 DIAGNOSIS — K219 Gastro-esophageal reflux disease without esophagitis: Secondary | ICD-10-CM | POA: Insufficient documentation

## 2019-03-17 DIAGNOSIS — Z7984 Long term (current) use of oral hypoglycemic drugs: Secondary | ICD-10-CM | POA: Diagnosis not present

## 2019-03-17 DIAGNOSIS — M1710 Unilateral primary osteoarthritis, unspecified knee: Secondary | ICD-10-CM | POA: Diagnosis not present

## 2019-03-17 DIAGNOSIS — D122 Benign neoplasm of ascending colon: Secondary | ICD-10-CM | POA: Insufficient documentation

## 2019-03-17 DIAGNOSIS — E119 Type 2 diabetes mellitus without complications: Secondary | ICD-10-CM | POA: Insufficient documentation

## 2019-03-17 DIAGNOSIS — K635 Polyp of colon: Secondary | ICD-10-CM | POA: Diagnosis not present

## 2019-03-17 DIAGNOSIS — Z79899 Other long term (current) drug therapy: Secondary | ICD-10-CM | POA: Insufficient documentation

## 2019-03-17 HISTORY — PX: COLONOSCOPY WITH PROPOFOL: SHX5780

## 2019-03-17 LAB — GLUCOSE, CAPILLARY: Glucose-Capillary: 157 mg/dL — ABNORMAL HIGH (ref 70–99)

## 2019-03-17 SURGERY — COLONOSCOPY WITH PROPOFOL
Anesthesia: General

## 2019-03-17 MED ORDER — LIDOCAINE HCL (CARDIAC) PF 100 MG/5ML IV SOSY
PREFILLED_SYRINGE | INTRAVENOUS | Status: DC | PRN
  Administered 2019-03-17: 60 mg via INTRAVENOUS

## 2019-03-17 MED ORDER — SODIUM CHLORIDE 0.9 % IV SOLN
INTRAVENOUS | Status: DC
  Administered 2019-03-17: 1000 mL via INTRAVENOUS

## 2019-03-17 MED ORDER — PROPOFOL 10 MG/ML IV BOLUS
INTRAVENOUS | Status: DC | PRN
  Administered 2019-03-17: 70 mg via INTRAVENOUS

## 2019-03-17 MED ORDER — PROPOFOL 500 MG/50ML IV EMUL
INTRAVENOUS | Status: DC | PRN
  Administered 2019-03-17: 140 ug/kg/min via INTRAVENOUS

## 2019-03-17 NOTE — Anesthesia Post-op Follow-up Note (Signed)
Anesthesia QCDR form completed.        

## 2019-03-17 NOTE — Anesthesia Postprocedure Evaluation (Signed)
Anesthesia Post Note  Patient: Sheena Parker  Procedure(s) Performed: COLONOSCOPY WITH PROPOFOL (N/A )  Patient location during evaluation: Endoscopy Anesthesia Type: General Level of consciousness: awake and alert Pain management: pain level controlled Vital Signs Assessment: post-procedure vital signs reviewed and stable Respiratory status: spontaneous breathing and respiratory function stable Cardiovascular status: stable Anesthetic complications: no     Last Vitals:  Vitals:   03/17/19 0916 03/17/19 0917  BP: 97/60 97/60  Pulse: (!) 52 (!) 52  Resp: 15 11  Temp: 36.7 C 36.7 C  SpO2: 99% 100%    Last Pain:  Vitals:   03/17/19 0917  TempSrc:   PainSc: 0-No pain                 KEPHART,WILLIAM K

## 2019-03-17 NOTE — Transfer of Care (Signed)
Immediate Anesthesia Transfer of Care Note  Patient: Sheena Parker  Procedure(s) Performed: COLONOSCOPY WITH PROPOFOL (N/A )  Patient Location: PACU  Anesthesia Type:General  Level of Consciousness: sedated  Airway & Oxygen Therapy: Patient Spontanous Breathing  Post-op Assessment: Report given to RN and Post -op Vital signs reviewed and stable  Post vital signs: Reviewed and stable  Last Vitals:  Vitals Value Taken Time  BP 97/60 03/17/19 0917  Temp 36.7 C 03/17/19 0917  Pulse 51 03/17/19 0917  Resp 12 03/17/19 0917  SpO2 100 % 03/17/19 0917  Vitals shown include unvalidated device data.  Last Pain:  Vitals:   03/17/19 0917  TempSrc:   PainSc: 0-No pain         Complications: No apparent anesthesia complications

## 2019-03-17 NOTE — H&P (Signed)
Sheena Antigua, MD [address] [place], [place], [place], [address] [address], [place], [place], [place], [address] Phone: [phone]  Fax: [phone]  Primary Care Physician:  Cletis Athens, MD   Pre-Procedure History & Physical: HPI:  Sheena Parker is a 68 y.o. female is here for a colonoscopy.   Past Medical History:  Diagnosis Date  . Arthritis    KNEE  . Diabetes mellitus without complication ([place])   . Difficult intubation   . GERD (gastroesophageal reflux disease)    NO MEDS  . Hyperlipidemia   . Hypertension   . Hypothyroidism   . Thyroid disease     Past Surgical History:  Procedure Laterality Date  . CHOLECYSTECTOMY N/A 11/14/2016   Procedure: LAPAROSCOPIC CHOLECYSTECTOMY WITH INTRAOPERATIVE CHOLANGIOGRAM;  Surgeon: Robert Bellow, MD;  Location: [facility] ORS;  Service: General;  Laterality: N/A;  . NO PAST SURGERIES      Prior to Admission medications   Medication Sig Start Date End Date Taking? Authorizing Provider  acetaminophen (TYLENOL) 325 MG tablet Take 650 mg by mouth every 6 (six) hours as needed for mild pain or headache.    Yes [provider]  aspirin EC 81 MG tablet Take 81 mg by mouth daily.   Yes [provider]  HYDROcodone-acetaminophen (NORCO) 5-325 MG tablet Take 1-2 tablets by mouth every 4 (four) hours as needed for moderate pain. 11/14/16  Yes Byrnett, Forest Gleason, MD  levothyroxine (SYNTHROID, LEVOTHROID) 50 MCG tablet Take 50 mcg by mouth daily before breakfast.   Yes [provider]  lisinopril (PRINIVIL,ZESTRIL) 10 MG tablet Take 10 mg by mouth every morning.    Yes [provider]  metFORMIN (GLUCOPHAGE) 500 MG tablet Take 500 mg by mouth 2 (two) times daily with a meal.   Yes [provider]    Allergies as of 02/26/2019  . (No Known Allergies)    Family History  Problem Relation Age of Onset  . Breast cancer Mother 59  . Breast cancer Sister 15  . Breast cancer Sister 40   . Breast cancer Sister 42  . Colon cancer Neg Hx     Social History   Socioeconomic History  . Marital status: Married    Spouse name: Not on file  . Number of children: Not on file  . Years of education: Not on file  . Highest education level: Not on file  Occupational History  . Not on file  Social Needs  . Financial resource strain: Not on file  . Food insecurity    Worry: Not on file    Inability: Not on file  . Transportation needs    Medical: Not on file    Non-medical: Not on file  Tobacco Use  . Smoking status: Never Smoker  . Smokeless tobacco: Never Used  Substance and Sexual Activity  . Alcohol use: No  . Drug use: No  . Sexual activity: Not on file  Lifestyle  . Physical activity    Days per week: Not on file    Minutes per session: Not on file  . Stress: Not on file  Relationships  . Social Herbalist on phone: Not on file    Gets together: Not on file    Attends religious service: Not on file    Active member of club or organization: Not on file    Attends meetings of clubs or organizations: Not on file    Relationship status: Not on file  .  Intimate partner violence    Fear of current or ex partner: Not on file    Emotionally abused: Not on file    Physically abused: Not on file    Forced sexual activity: Not on file  Other Topics Concern  . Not on file  Social History Narrative  . Not on file    Review of Systems: See HPI, otherwise negative ROS  Physical Exam: There were no vitals taken for this visit. General:   Alert,  pleasant and cooperative in NAD Head:  Normocephalic and atraumatic. Neck:  Supple; no masses or thyromegaly. Lungs:  Clear throughout to auscultation, normal respiratory effort.    Heart:  +S1, +S2, Regular rate and rhythm, No edema. Abdomen:  Soft, nontender and nondistended. Normal bowel sounds, without guarding, and without rebound.   Neurologic:  Alert and  oriented x4;  grossly normal neurologically.   Impression/Plan: Sheena Parker is here for a colonoscopy to be performed for average risk screening.  Risks, benefits, limitations, and alternatives regarding  colonoscopy have been reviewed with the patient.  Questions have been answered.  All parties agreeable.   Virgel Manifold, MD  03/17/2019, 8:22 AM

## 2019-03-17 NOTE — Op Note (Signed)
[facility] Patient Name: Sheena Parker Procedure Date: 03/17/2019 8:45 AM MRN: [id number] Account #: [id number] Date of Birth: February 04, 1951 Admit Type: Outpatient Age: 68 Room: [facility] ENDO ROOM 1 Gender: Female Note Status: Finalized Procedure:             Colonoscopy Indications:           Screening for colorectal malignant neoplasm Providers:             Maximus Hoffert B. Bonna Gains MD, MD Referring MD:          Cletis Athens, MD (Referring MD) Medicines:             Monitored Anesthesia Care Complications:         No immediate complications. Procedure:             Pre-Anesthesia Assessment:                        - ASA Grade Assessment: II - A patient with mild                         systemic disease.                        - Prior to the procedure, a History and Physical was                         performed, and patient medications, allergies and                         sensitivities were reviewed. The patient's tolerance                         of previous anesthesia was reviewed.                        - The risks and benefits of the procedure and the                         sedation options and risks were discussed with the                         patient. All questions were answered and informed                         consent was obtained.                        - Patient identification and proposed procedure were                         verified prior to the procedure by the physician, the                         nurse, the anesthesiologist, the anesthetist and the                         technician. The procedure was verified in the                         procedure room.  After obtaining informed consent, the colonoscope was                         passed under direct vision. Throughout the procedure,                         the patient's blood pressure, pulse, and oxygen                         saturations were monitored  continuously. The                         Colonoscope was introduced through the anus and                         advanced to the the terminal ileum, with                         identification of the appendiceal orifice and IC                         valve. The colonoscopy was performed with ease. The                         patient tolerated the procedure well. The quality of                         the bowel preparation was good. Findings:      The perianal and digital rectal examinations were normal.      A 4 mm polyp was found in the ascending colon. The polyp was sessile.       The polyp was removed with a cold biopsy forceps. Resection and       retrieval were complete.      The exam was otherwise without abnormality.      The rectum, sigmoid colon, descending colon, transverse colon, ascending       colon and cecum appeared normal.      The retroflexed view of the distal rectum and anal verge was normal and       showed no anal or rectal abnormalities. Impression:            - One 4 mm polyp in the ascending colon, removed with                         a cold biopsy forceps. Resected and retrieved.                        - The examination was otherwise normal.                        - The rectum, sigmoid colon, descending colon,                         transverse colon, ascending colon and cecum are normal.                        - The distal rectum and anal verge are normal on  retroflexion view. Recommendation:        - Discharge patient to home (with escort).                        - Advance diet as tolerated.                        - Continue present medications.                        - Await pathology results.                        - Repeat colonoscopy in 5 years if polyps show                         adenoma, 10 years if they are hyperplastic.                        - The findings and recommendations were discussed with                         the  patient.                        - The findings and recommendations were discussed with                         the patient's family.                        - Return to primary care physician as previously                         scheduled. Procedure Code(s):     --- Professional ---                        [phone], Colonoscopy, flexible; with biopsy, single or                         multiple Diagnosis Code(s):     --- Professional ---                        Z12.11, Encounter for screening for malignant neoplasm                         of colon                        K63.5, Polyp of colon CPT copyright 2019 American Medical Association. All rights reserved. The codes documented in this report are preliminary and upon coder review may  be revised to meet current compliance requirements.  Vonda Antigua, MD Margretta Sidle B. Bonna Gains MD, MD 03/17/2019 9:20:27 AM This report has been signed electronically. Number of Addenda: 0 Note Initiated On: 03/17/2019 8:45 AM Scope Withdrawal Time: 0 hours 14 minutes 53 seconds  Total Procedure Duration: 0 hours 19 minutes 55 seconds  Estimated Blood Loss:  Estimated blood loss: none.      [facility]

## 2019-03-17 NOTE — Anesthesia Preprocedure Evaluation (Signed)
Anesthesia Evaluation  Patient identified by MRN, date of birth, ID band Patient awake    Reviewed: Allergy & Precautions, NPO status , Patient's Chart, lab work & pertinent test results  History of Anesthesia Complications (+) DIFFICULT AIRWAY and history of anesthetic complications  Airway Mallampati: III       Dental  (+) Missing, Chipped   Pulmonary neg sleep apnea, neg COPD, Not current smoker,           Cardiovascular hypertension, Pt. on medications (-) Past MI and (-) CHF (-) dysrhythmias (-) Valvular Problems/Murmurs     Neuro/Psych neg Seizures    GI/Hepatic Neg liver ROS, GERD  Medicated,  Endo/Other  diabetes, Type 2, Oral Hypoglycemic AgentsHypothyroidism   Renal/GU negative Renal ROS     Musculoskeletal   Abdominal   Peds  Hematology   Anesthesia Other Findings   Reproductive/Obstetrics                            Anesthesia Physical Anesthesia Plan  ASA: III  Anesthesia Plan: General   Post-op Pain Management:    Induction:   PONV Risk Score and Plan: 3 and Propofol infusion, TIVA and Treatment may vary due to age or medical condition  Airway Management Planned: Nasal Cannula  Additional Equipment:   Intra-op Plan:   Post-operative Plan:   Informed Consent: I have reviewed the patients History and Physical, chart, labs and discussed the procedure including the risks, benefits and alternatives for the proposed anesthesia with the patient or authorized representative who has indicated his/her understanding and acceptance.       Plan Discussed with:   Anesthesia Plan Comments:         Anesthesia Quick Evaluation

## 2019-03-18 ENCOUNTER — Encounter: Payer: Self-pay | Admitting: Gastroenterology

## 2019-03-19 ENCOUNTER — Encounter: Payer: Self-pay | Admitting: Gastroenterology

## 2019-03-19 LAB — SURGICAL PATHOLOGY

## 2019-06-08 ENCOUNTER — Ambulatory Visit: Payer: Medicare Other

## 2019-06-08 ENCOUNTER — Ambulatory Visit: Payer: Medicare Other | Attending: Internal Medicine

## 2019-06-08 DIAGNOSIS — Z23 Encounter for immunization: Secondary | ICD-10-CM | POA: Insufficient documentation

## 2019-06-08 NOTE — Progress Notes (Signed)
   Covid-19 Vaccination Clinic  Name:  Sheena Parker    MRN: [id number] DOB: Mar 24, 1951  06/08/2019  Sheena Parker was observed post Covid-19 immunization for 15 minutes without incidence. She was provided with Vaccine Information Sheet and instruction to access the V-Safe system.   Sheena Parker was instructed to call 911 with any severe reactions post vaccine: Marland Kitchen Difficulty breathing  . Swelling of your face and throat  . A fast heartbeat  . A bad rash all over your body  . Dizziness and weakness    Immunizations Administered    Name Date Dose VIS Date Route   Pfizer COVID-19 Vaccine 06/08/2019  1:07 PM 0.3 mL 04/18/2019 Intramuscular   Manufacturer: [place]   Lot: [id number]   [place]: [id number]

## 2019-06-13 ENCOUNTER — Ambulatory Visit: Payer: Medicare Other

## 2019-06-29 ENCOUNTER — Ambulatory Visit: Payer: Medicare Other | Attending: Internal Medicine

## 2019-06-29 DIAGNOSIS — Z23 Encounter for immunization: Secondary | ICD-10-CM | POA: Insufficient documentation

## 2019-06-29 NOTE — Progress Notes (Signed)
   Covid-19 Vaccination Clinic  Name:  Sheena Parker    MRN: [id number] DOB: October 13, 1950  06/29/2019  [name] was observed post Covid-19 immunization for 15 minutes without incidence. She was provided with Vaccine Information Sheet and instruction to access the V-Safe system.   [name] was instructed to call 911 with any severe reactions post vaccine: Marland Kitchen Difficulty breathing  . Swelling of your face and throat  . A fast heartbeat  . A bad rash all over your body  . Dizziness and weakness    Immunizations Administered    Name Date Dose VIS Date Route   Pfizer COVID-19 Vaccine 06/29/2019 12:22 PM 0.3 mL 04/18/2019 Intramuscular   Manufacturer: [place]   Lot: [id number]   [place]: [id number]

## 2019-07-15 ENCOUNTER — Ambulatory Visit (INDEPENDENT_AMBULATORY_CARE_PROVIDER_SITE_OTHER): Payer: Medicare Other | Admitting: Vascular Surgery

## 2019-07-15 ENCOUNTER — Other Ambulatory Visit: Payer: Self-pay

## 2019-07-15 ENCOUNTER — Encounter (INDEPENDENT_AMBULATORY_CARE_PROVIDER_SITE_OTHER): Payer: Self-pay | Admitting: Vascular Surgery

## 2019-07-15 DIAGNOSIS — E119 Type 2 diabetes mellitus without complications: Secondary | ICD-10-CM | POA: Insufficient documentation

## 2019-07-15 DIAGNOSIS — I10 Essential (primary) hypertension: Secondary | ICD-10-CM | POA: Diagnosis not present

## 2019-07-15 DIAGNOSIS — R0989 Other specified symptoms and signs involving the circulatory and respiratory systems: Secondary | ICD-10-CM | POA: Insufficient documentation

## 2019-07-15 NOTE — Patient Instructions (Signed)
Carotid Artery Disease  Carotid artery disease is the narrowing or blockage of one or both carotid arteries. This condition is also called carotid artery stenosis. The carotid arteries are the two main blood vessels on either side of the neck. They send blood to the brain, other parts of the head, and the neck.  This condition increases your risk for a stroke or a transient ischemic attack (TIA). A TIA is a "mini-stroke" that causes stroke-like symptoms that go away quickly. What are the causes? This condition is mainly caused by a narrowing and hardening of the carotid arteries. The carotid arteries can become narrow or clogged with a buildup of plaque. Plaque includes:  Fat.  Cholesterol.  Calcium.  Other substances. What increases the risk? The following factors may make you more likely to develop this condition:  Having certain medical conditions, such as: ? High cholesterol. ? High blood pressure. ? Diabetes. ? Obesity.  Smoking.  A family history of cardiovascular disease.  Not being active or lack of regular exercise.  Being female. Men have a higher risk of having arteries become narrow and harden earlier in life than women.  Old age. What are the signs or symptoms? This condition may not have any signs or symptoms until a stroke or TIA happens. In some cases, your doctor may be able to hear a whooshing sound. This can suggest a change in blood flow caused by plaque buildup. An eye exam can also help find signs of the condition. How is this treated? This condition may be treated with more than one treatment. Treatment options include:  Lifestyle changes, such as: ? Quitting smoking. ? Getting regular exercise, or getting exercise as told by your doctor. ? Eating a healthy diet. ? Managing stress. ? Keeping a healthy weight.  Medicines to control: ? Blood pressure. ? Cholesterol. ? Blood clotting.  Surgery. You may have: ? A surgery to remove the blockages in  the carotid arteries. ? A procedure in which a small mesh tube (stent) is used to widen the blocked carotid arteries. Follow these instructions at home: Eating and drinking Follow instructions about your diet from your doctor. It is important to follow a healthy diet.  Eat a diet that includes: ? A lot of fresh fruits and vegetables. ? Low-fat (lean) meats.  Avoid these foods: ? Foods that are high in fat. ? Foods that are high in salt (sodium). ? Foods that are fried. ? Foods that are processed. ? Foods that have few good nutrients (poor nutritional value).  Lifestyle   Keep a healthy weight.  Do exercises as told by your doctor to stay active. Each week, you should get one of the following: ? At least 150 minutes of exercise that raises your heart rate and makes you sweat (moderate-intensity exercise). ? At least 75 minutes of exercise that takes a lot of effort.  Do not use any products that contain nicotine or tobacco, such as cigarettes, e-cigarettes, and chewing tobacco. If you need help quitting, ask your doctor.  Do not drink alcohol if: ? Your doctor tells you not to drink. ? You are pregnant, may be pregnant, or are planning to become pregnant.  If you drink alcohol: ? Limit how much you use to:  0-1 drink a day for women.  0-2 drinks a day for men. ? Be aware of how much alcohol is in your drink. In the [place] one drink equals one 12 oz bottle of beer (355 mL), one 5   oz glass of wine (148 mL), or one 1 oz glass of hard liquor (44 mL).  Do not use drugs.  Manage your stress. Ask your doctor for tips on how to do this. General instructions  Take over-the-counter and prescription medicines only as told by your doctor.  Keep all follow-up visits as told by your doctor. This is important. Where to find more information  American Heart Association: www.heart.org Get help right away if:  You have any signs of a stroke. "BE FAST" is an easy way to remember the  main warning signs: ? B - Balance. Signs are dizziness, sudden trouble walking, or loss of balance. ? E - Eyes. Signs are trouble seeing or a change in how you see. ? F - Face. Signs are sudden weakness or loss of feeling of the face, or the face or eyelid drooping on one side. ? A - Arms. Signs are weakness or loss of feeling in an arm. This happens suddenly and usually on one side of the body. ? S - Speech. Signs are sudden trouble speaking, slurred speech, or trouble understanding what people say. ? T - Time. Time to call emergency services. Write down what time symptoms started.  You have other signs of a stroke, such as: ? A sudden, very bad headache with no known cause. ? Feeling like you may vomit (nausea). ? Vomiting. ? A seizure. These symptoms may be an emergency. Do not wait to see if the symptoms will go away. Get medical help right away. Call your local emergency services (911 in the [place] Do not drive yourself to the hospital. Summary  The carotid arteries are blood vessels on both sides of the neck.  If these arteries get smaller or get blocked, you are more likely to have a stroke or a mini-stroke.  This condition can be treated with lifestyle changes, medicines, surgery, or a blend of these treatments.  Get help right away if you have any signs of a stroke. "BE FAST" is an easy way to remember the main warning signs of stroke. This information is not intended to replace advice given to you by your health care provider. Make sure you discuss any questions you have with your health care provider. Document Revised: 11/18/2018 Document Reviewed: 11/18/2018 Elsevier Patient Education  2020 Elsevier Inc.  

## 2019-07-15 NOTE — Assessment & Plan Note (Signed)
blood pressure control important in reducing the progression of atherosclerotic disease. On appropriate oral medications.  

## 2019-07-15 NOTE — Assessment & Plan Note (Signed)
blood glucose control important in reducing the progression of atherosclerotic disease. Also, involved in wound healing. On appropriate medications.  

## 2019-07-15 NOTE — Assessment & Plan Note (Signed)
The patient has an asymptomatic carotid bruit that should be assessed with carotid duplex.  That will be done in the near future at their convenience.  I have discussed the pathophysiology and natural history of carotid disease.  I discussed the reason and rationale for evaluation and treatment, particularly for stroke risk reduction.  The patient voices their understanding and they will return with carotid duplex in the near future at their convenience.

## 2019-07-15 NOTE — Progress Notes (Signed)
Patient ID: Sheena Parker, female   DOB: 02/29/1951, 69 y.o.   MRN: [id number]  Chief Complaint  Patient presents with  . New Patient (Initial Visit)    ref Junio left carotid bruit    HPI Sheena Parker is a 69 y.o. female.  I am asked to see the patient by Dr. Lavera Guise for evaluation of left carotid bruit.  The patient reports some stiffness in the neck with occasional headaches and neck pain.  No previous history of focal neurologic symptoms. Specifically, the patient denies amaurosis fugax, speech or swallowing difficulties, or arm or leg weakness or numbness.  Her primary care physician noted an incidental finding of a left carotid bruit and she is referred for further evaluation and treatment.  No previous history of carotid disease to her knowledge.  No previous ultrasound or CT scans to check her carotid disease to their knowledge.   Past Medical History:  Diagnosis Date  . Arthritis    KNEE  . Diabetes mellitus without complication ([place])   . Difficult intubation   . GERD (gastroesophageal reflux disease)    NO MEDS  . Hyperlipidemia   . Hypertension   . Hypothyroidism   . Thyroid disease     Past Surgical History:  Procedure Laterality Date  . CHOLECYSTECTOMY N/A 11/14/2016   Procedure: LAPAROSCOPIC CHOLECYSTECTOMY WITH INTRAOPERATIVE CHOLANGIOGRAM;  Surgeon: Robert Bellow, MD;  Location: [facility] ORS;  Service: General;  Laterality: N/A;  . COLONOSCOPY WITH PROPOFOL N/A 03/17/2019   Procedure: COLONOSCOPY WITH PROPOFOL;  Surgeon: Virgel Manifold, MD;  Location: [facility] ENDOSCOPY;  Service: Endoscopy;  Laterality: N/A;  . NO PAST SURGERIES      Family History  Problem Relation Age of Onset  . Breast cancer Mother 7  . Breast cancer Sister 34  . Breast cancer Sister 36  . Breast cancer Sister 42  . Colon cancer Neg Hx     Social History   Tobacco Use  . Smoking status: Never Smoker  . Smokeless tobacco: Never Used  Substance Use Topics  . Alcohol use: No    . Drug use: No     No Known Allergies  Current Outpatient Medications  Medication Sig Dispense Refill  . acetaminophen (TYLENOL) 325 MG tablet Take 650 mg by mouth every 6 (six) hours as needed for mild pain or headache.     Marland Kitchen aspirin EC 81 MG tablet Take 81 mg by mouth daily.    Marland Kitchen HYDROcodone-acetaminophen (NORCO) 5-325 MG tablet Take 1-2 tablets by mouth every 4 (four) hours as needed for moderate pain. 30 tablet 0  . levothyroxine (SYNTHROID, LEVOTHROID) 50 MCG tablet Take 50 mcg by mouth daily before breakfast.    . lisinopril (PRINIVIL,ZESTRIL) 10 MG tablet Take 10 mg by mouth every morning.     . metFORMIN (GLUCOPHAGE) 500 MG tablet Take 500 mg by mouth 2 (two) times daily with a meal.     No current facility-administered medications for this visit.      REVIEW OF SYSTEMS (Negative unless checked)  Constitutional: [] Weight loss  [] Fever  [] Chills Cardiac: [] Chest pain   [] Chest pressure   [] Palpitations   [] Shortness of breath when laying flat   [] Shortness of breath at rest   [] Shortness of breath with exertion. Vascular:  [] Pain in legs with walking   [] Pain in legs at rest   [] Pain in legs when laying flat   [] Claudication   [] Pain in feet when walking  [] Pain in feet at rest  []   Pain in feet when laying flat   [] History of DVT   [] Phlebitis   [] Swelling in legs   [] Varicose veins   [] Non-healing ulcers Pulmonary:   [] Uses home oxygen   [] Productive cough   [] Hemoptysis   [] Wheeze  [] COPD   [] Asthma Neurologic:  [] Dizziness  [] Blackouts   [] Seizures   [] History of stroke   [] History of TIA  [] Aphasia   [] Temporary blindness   [] Dysphagia   [] Weakness or numbness in arms   [] Weakness or numbness in legs Musculoskeletal:  [x] Arthritis   [] Joint swelling   [x] Joint pain   [] Low back pain Hematologic:  [] Easy bruising  [] Easy bleeding   [] Hypercoagulable state   [] Anemic  [] Hepatitis Gastrointestinal:  [] Blood in stool   [] Vomiting blood  [] Gastroesophageal reflux/heartburn    [] Abdominal pain Genitourinary:  [] Chronic kidney disease   [] Difficult urination  [] Frequent urination  [] Burning with urination   [] Hematuria Skin:  [] Rashes   [] Ulcers   [] Wounds Psychological:  [] History of anxiety   []  History of major depression.    Physical Exam BP (!) 189/73 (BP Location: Right Arm)   Pulse 60   Resp 16   Ht 5' (1.524 m)   Wt 134 lb 12.8 oz (61.1 kg)   BMI 26.33 kg/m  Gen:  WD/WN, NAD.  Appears younger than stated age Head: [place]/AT, No temporalis wasting.  Ear/Nose/Throat: Hearing grossly intact, nares w/o erythema or drainage, oropharynx w/o Erythema/Exudate Eyes: Conjunctiva clear, sclera non-icteric  Neck: trachea midline.  Left carotid bruit is present Pulmonary:  Good air movement, clear to auscultation bilaterally.  Cardiac: RRR, normal S1, S2, no Murmurs, rubs or gallops. Vascular:  Vessel Right Left  Radial Palpable Palpable                       Musculoskeletal: M/S 5/5 throughout.  Extremities without ischemic changes.  No deformity or atrophy. No significant LE edema. Neurologic: Sensation grossly intact in extremities.  Symmetrical.  Speech is fluent. Motor exam as listed above. Psychiatric: Judgment intact, Mood & affect appropriate for pt's clinical situation. Dermatologic: No rashes or ulcers noted.  No cellulitis or open wounds.   Radiology No results found.  Labs No results found for this or any previous visit (from the past 2160 hour(s)).  Assessment/Plan:  Essential hypertension blood pressure control important in reducing the progression of atherosclerotic disease. On appropriate oral medications.   Diabetes ([place]) blood glucose control important in reducing the progression of atherosclerotic disease. Also, involved in wound healing. On appropriate medications.   Left carotid bruit The patient has an asymptomatic carotid bruit that should be assessed with carotid duplex.  That will be done in the near future at their  convenience.  I have discussed the pathophysiology and natural history of carotid disease.  I discussed the reason and rationale for evaluation and treatment, particularly for stroke risk reduction.  The patient voices their understanding and they will return with carotid duplex in the near future at their convenience.      Leotis Pain 07/15/2019, 10:34 AM   This note was created with Dragon medical transcription system.  Any errors from dictation are unintentional.

## 2019-08-26 ENCOUNTER — Ambulatory Visit (INDEPENDENT_AMBULATORY_CARE_PROVIDER_SITE_OTHER): Payer: Medicare Other | Admitting: Vascular Surgery

## 2019-08-26 ENCOUNTER — Encounter (INDEPENDENT_AMBULATORY_CARE_PROVIDER_SITE_OTHER): Payer: Medicare Other

## 2019-09-09 ENCOUNTER — Ambulatory Visit (INDEPENDENT_AMBULATORY_CARE_PROVIDER_SITE_OTHER): Payer: Medicare Other | Admitting: Vascular Surgery

## 2019-09-09 ENCOUNTER — Encounter (INDEPENDENT_AMBULATORY_CARE_PROVIDER_SITE_OTHER): Payer: Medicare Other

## 2019-09-22 ENCOUNTER — Other Ambulatory Visit: Payer: Self-pay | Admitting: Internal Medicine

## 2019-09-23 ENCOUNTER — Encounter (INDEPENDENT_AMBULATORY_CARE_PROVIDER_SITE_OTHER): Payer: Medicare Other

## 2019-09-23 ENCOUNTER — Ambulatory Visit (INDEPENDENT_AMBULATORY_CARE_PROVIDER_SITE_OTHER): Payer: Medicare Other | Admitting: Vascular Surgery

## 2019-09-26 ENCOUNTER — Encounter (INDEPENDENT_AMBULATORY_CARE_PROVIDER_SITE_OTHER): Payer: Self-pay | Admitting: Vascular Surgery

## 2019-09-26 ENCOUNTER — Other Ambulatory Visit: Payer: Self-pay

## 2019-09-26 ENCOUNTER — Ambulatory Visit (INDEPENDENT_AMBULATORY_CARE_PROVIDER_SITE_OTHER): Payer: Medicare Other | Admitting: Vascular Surgery

## 2019-09-26 ENCOUNTER — Ambulatory Visit (INDEPENDENT_AMBULATORY_CARE_PROVIDER_SITE_OTHER): Payer: Medicare Other

## 2019-09-26 VITALS — BP 166/73 | HR 54 | Ht 60.0 in | Wt 128.0 lb

## 2019-09-26 DIAGNOSIS — I6529 Occlusion and stenosis of unspecified carotid artery: Secondary | ICD-10-CM | POA: Insufficient documentation

## 2019-09-26 DIAGNOSIS — I6523 Occlusion and stenosis of bilateral carotid arteries: Secondary | ICD-10-CM | POA: Diagnosis not present

## 2019-09-26 DIAGNOSIS — E119 Type 2 diabetes mellitus without complications: Secondary | ICD-10-CM

## 2019-09-26 DIAGNOSIS — I10 Essential (primary) hypertension: Secondary | ICD-10-CM

## 2019-09-26 DIAGNOSIS — R0989 Other specified symptoms and signs involving the circulatory and respiratory systems: Secondary | ICD-10-CM

## 2019-09-26 NOTE — Patient Instructions (Signed)
Carotid Artery Disease  Carotid artery disease, also called carotid artery stenosis, is the narrowing or blockage of one or both carotid arteries. The carotid arteries are the two main blood vessels on either side of the neck. They supply blood to the brain, other parts of the head, and the neck. Carotid artery disease increases your risk for a stroke or a transient ischemic attack (TIA). A TIA is a "mini-stroke" that causes stroke-like symptoms that then go away quickly. What are the causes? This condition is mainly caused by a narrowing and hardening of the carotid arteries (atherosclerosis). The carotid arteries can become narrow or clogged with a buildup of fat, cholesterol, calcium, and other substances (plaque). What increases the risk? The following factors may make you more likely to develop this condition:  Having certain medical conditions, such as: ? High cholesterol. ? High blood pressure (hypertension). ? Diabetes. ? Obesity.  Smoking.  A family history of cardiovascular disease.  Inactivity or lack of regular exercise.  Being female. Men have an increased risk of developing atherosclerosis earlier in life than women.  Old age. What are the signs or symptoms? This condition may not have any signs or symptoms until a stroke or TIA occurs. In some cases, your health care provider may be able to hear a whooshing sound (bruit). This can indicate a change in blood flow caused by plaque buildup. An eye exam can also help identify signs of the condition. How is this diagnosed? This condition may be diagnosed with a physical exam, your medical history, and your family's medical history. You may also have tests that look at the blood flow in your carotid arteries, such as:  Carotid artery ultrasound, which uses sound waves to create pictures to show if the arteries are narrow or blocked.  Tests that use a dye injected into a vein to highlight your arteries on images, such  as: ? Carotid or cerebral angiography, which uses X-rays. ? Computerized tomographic angiography (CTA), which uses CT scans. ? Magnetic resonance angiography (MRA), which uses MRI. How is this treated? This condition may be treated with a combination of treatments. Treatment options include:  Lifestyle changes, such as: ? Quitting smoking. ? Exercising regularly or as told by your health care provider. ? Eating a heart-healthy diet. ? Managing stress. ? Maintaining a healthy weight.  Medicines to control blood pressure, cholesterol, and blood clotting.  Surgery. You may have: ? A carotid endarterectomy. This is a surgery to remove the blockages in the carotid arteries. ? A carotid angioplasty with stenting. This is a procedure in which a small mesh tube (stent) is used to widen the blocked carotid arteries. Follow these instructions at home: Eating and drinking Follow instructions about your diet from your health care provider. It is important to:  Eat a healthy diet that is low in saturated fats and includes plenty of fresh fruits, vegetables, and lean meats.  Avoid foods that are high in fat and salt (sodium).  Avoid foods that are fried, overly processed, or have poor nutritional value.  Lifestyle   Maintain a healthy weight.  Do exercises as told by your health care provider to stay physically active. It is recommended that each week you get at least 150 minutes of moderate-intensity exercise or 75 minutes of exercise that takes a lot of effort.  Do not use any products that contain nicotine or tobacco, such as cigarettes, e-cigarettes, and chewing tobacco. If you need help quitting, ask your health care provider.    Do not drink alcohol if: ? Your health care provider tells you not to drink. ? You are pregnant, may be pregnant, or are planning to become pregnant.  If you drink alcohol: ? Limit how much you use to:  0-1 drink a day for women.  0-2 drinks a day for  men. ? Be aware of how much alcohol is in your drink. In the [place] one drink equals one 12 oz bottle of beer (355 mL), one 5 oz glass of wine (148 mL), or one 1 oz glass of hard liquor (44 mL).  Do not use drugs.  Manage your stress. Ask your health care provider for stress management tips. General instructions  Take over-the-counter and prescription medicines only as told by your health care provider.  Keep all follow-up visits as told by your health care provider. This is important. Where to find more information  American Heart Association: www.heart.org Get help right away if:  You have any symptoms of a stroke. "BE FAST" is an easy way to remember the main warning signs of a stroke: ? B - Balance. Signs are dizziness, sudden trouble walking, or loss of balance. ? E - Eyes. Signs are trouble seeing or a sudden change in vision. ? F - Face. Signs are sudden weakness or numbness of the face, or the face or eyelid drooping on one side. ? A - Arms. Signs are weakness or numbness in an arm. This happens suddenly and usually on one side of the body. ? S - Speech. Signs are sudden trouble speaking, slurred speech, or trouble understanding what people say. ? T - Time. Time to call emergency services. Write down what time symptoms started.  You have other signs of a stroke, such as: ? A sudden, severe headache with no known cause. ? Nausea or vomiting. ? Seizure. These symptoms may represent a serious problem that is an emergency. Do not wait to see if the symptoms will go away. Get medical help right away. Call your local emergency services (911 in the [place] Do not drive yourself to the hospital. Summary  Carotid artery disease, also called carotid artery stenosis, is the narrowing or blockage of one or both carotid arteries.  Carotid artery disease increases your risk for a stroke or a transient ischemic attack (TIA).  This condition can be treated with lifestyle changes, medicines,  surgery, or a combination of these treatments.  Get help right away if you have any symptoms of stroke. The acronym BEFAST is an easy way to remember the main warning signs of stroke. This information is not intended to replace advice given to you by your health care provider. Make sure you discuss any questions you have with your health care provider. Document Revised: 11/04/2018 Document Reviewed: 11/04/2018 Elsevier Patient Education  2020 [employer] Inc[employer]  

## 2019-09-26 NOTE — Progress Notes (Signed)
MRN : [id number]  Sheena Parker is a 69 y.o. (1950-08-20) female who presents with chief complaint of  Chief Complaint  Patient presents with  . Follow-up    U/S follow up  .  History of Present Illness: Patient returns today in follow up of her carotid disease.  She remains asymptomatic and has had no issues since her last visit.  Duplex today shows 60 to 79% right ICA stenosis and 80 to 99% left ICA stenosis.  Current Outpatient Medications  Medication Sig Dispense Refill  . acetaminophen (TYLENOL) 325 MG tablet Take 650 mg by mouth every 6 (six) hours as needed for mild pain or headache.     Marland Kitchen aspirin EC 81 MG tablet Take 81 mg by mouth daily.    Marland Kitchen HYDROcodone-acetaminophen (NORCO) 5-325 MG tablet Take 1-2 tablets by mouth every 4 (four) hours as needed for moderate pain. 30 tablet 0  . levothyroxine (SYNTHROID, LEVOTHROID) 50 MCG tablet Take 50 mcg by mouth daily before breakfast.    . lisinopril (PRINIVIL,ZESTRIL) 10 MG tablet Take 10 mg by mouth every morning.     Marland Kitchen lisinopril (ZESTRIL) 20 MG tablet Take 1 tablet by mouth once daily 90 tablet 0  . losartan-hydrochlorothiazide (HYZAAR) 100-12.5 MG tablet Take 1 tablet by mouth daily.    . metFORMIN (GLUCOPHAGE) 500 MG tablet Take 500 mg by mouth 2 (two) times daily with a meal.     No current facility-administered medications for this visit.    Past Medical History:  Diagnosis Date  . Arthritis    KNEE  . Diabetes mellitus without complication ()   . Difficult intubation   . GERD (gastroesophageal reflux disease)    NO MEDS  . Hyperlipidemia   . Hypertension   . Hypothyroidism   . Thyroid disease     Past Surgical History:  Procedure Laterality Date  . CHOLECYSTECTOMY N/A 11/14/2016   Procedure: LAPAROSCOPIC CHOLECYSTECTOMY WITH INTRAOPERATIVE CHOLANGIOGRAM;  Surgeon: Robert Bellow, MD;  Location: [facility] ORS;  Service: General;  Laterality: N/A;  . COLONOSCOPY WITH PROPOFOL N/A 03/17/2019   Procedure:  COLONOSCOPY WITH PROPOFOL;  Surgeon: Virgel Manifold, MD;  Location: [facility] ENDOSCOPY;  Service: Endoscopy;  Laterality: N/A;  . NO PAST SURGERIES       Social History   Tobacco Use  . Smoking status: Never Smoker  . Smokeless tobacco: Never Used  Substance Use Topics  . Alcohol use: No  . Drug use: No      Family History  Problem Relation Age of Onset  . Breast cancer Mother 13  . Breast cancer Sister 13  . Breast cancer Sister 30  . Breast cancer Sister 38  . Colon cancer Neg Hx      No Known Allergies   REVIEW OF SYSTEMS (Negative unless checked)  Constitutional: [] Weight loss  [] Fever  [] Chills Cardiac: [] Chest pain   [] Chest pressure   [] Palpitations   [] Shortness of breath when laying flat   [] Shortness of breath at rest   [] Shortness of breath with exertion. Vascular:  [] Pain in legs with walking   [] Pain in legs at rest   [] Pain in legs when laying flat   [] Claudication   [] Pain in feet when walking  [] Pain in feet at rest  [] Pain in feet when laying flat   [] History of DVT   [] Phlebitis   [] Swelling in legs   [] Varicose veins   [] Non-healing ulcers Pulmonary:   [] Uses home oxygen   [] Productive cough   []   Hemoptysis   [] Wheeze  [] COPD   [] Asthma Neurologic:  [] Dizziness  [] Blackouts   [] Seizures   [] History of stroke   [] History of TIA  [] Aphasia   [] Temporary blindness   [] Dysphagia   [] Weakness or numbness in arms   [] Weakness or numbness in legs Musculoskeletal:  [x] Arthritis   [] Joint swelling   [x] Joint pain   [] Low back pain Hematologic:  [] Easy bruising  [] Easy bleeding   [] Hypercoagulable state   [] Anemic   Gastrointestinal:  [] Blood in stool   [] Vomiting blood  [] Gastroesophageal reflux/heartburn   [] Abdominal pain Genitourinary:  [] Chronic kidney disease   [] Difficult urination  [] Frequent urination  [] Burning with urination   [] Hematuria Skin:  [] Rashes   [] Ulcers   [] Wounds Psychological:  [] History of anxiety   []  History of major  depression.  Physical Examination  BP (!) 166/73   Pulse (!) 54   Ht 5' (1.524 m)   Wt 128 lb (58.1 kg)   BMI 25.00 kg/m  Gen:  WD/WN, NAD Head: [place]/AT, No temporalis wasting. Ear/Nose/Throat: Hearing grossly intact, nares w/o erythema or drainage Eyes: Conjunctiva clear. Sclera non-icteric Neck: Supple.  Trachea midline Pulmonary:  Good air movement, no use of accessory muscles.  Cardiac: RRR, no JVD Vascular:  Vessel Right Left  Radial Palpable Palpable                       Musculoskeletal: M/S 5/5 throughout.  No deformity or atrophy.  No edema. Neurologic: Sensation grossly intact in extremities.  Symmetrical.  Speech is fluent.  Psychiatric: Judgment intact, Mood & affect appropriate for pt's clinical situation. Dermatologic: No rashes or ulcers noted.  No cellulitis or open wounds.       Labs No results found for this or any previous visit (from the past 2160 hour(s)).  Radiology No results found.  Assessment/Plan Essential hypertension blood pressure control important in reducing the progression of atherosclerotic disease. On appropriate oral medications.   Diabetes ([place]) blood glucose control important in reducing the progression of atherosclerotic disease. Also, involved in wound healing. On appropriate medications.  Carotid stenosis Duplex today shows 60 to 79% right ICA stenosis and 80 to 99% left ICA stenosis. The patient remains asymptomatic with respect to the carotid stenosis.  However, the patient has now progressed and has a lesion the is >70%.  Patient should undergo CT angiography of the carotid arteries to define the degree of stenosis of the internal carotid arteries bilaterally and the anatomic suitability for surgery vs. intervention.  If the patient does indeed need surgery cardiac clearance will be required, once cleared the patient will be scheduled for surgery.  The risks, benefits and alternative therapies were reviewed in detail  with the patient.  All questions were answered.  The patient agrees to proceed with imaging.  Continue antiplatelet therapy as prescribed. Continue management of CAD, HTN and Hyperlipidemia. Healthy heart diet, encouraged exercise at least 4 times per week.      Leotis Pain, MD  09/26/2019 9:53 AM    This note was created with Dragon medical transcription system.  Any errors from dictation are purely unintentional

## 2019-09-26 NOTE — Assessment & Plan Note (Signed)
Duplex today shows 60 to 79% right ICA stenosis and 80 to 99% left ICA stenosis. The patient remains asymptomatic with respect to the carotid stenosis.  However, the patient has now progressed and has a lesion the is >70%.  Patient should undergo CT angiography of the carotid arteries to define the degree of stenosis of the internal carotid arteries bilaterally and the anatomic suitability for surgery vs. intervention.  If the patient does indeed need surgery cardiac clearance will be required, once cleared the patient will be scheduled for surgery.  The risks, benefits and alternative therapies were reviewed in detail with the patient.  All questions were answered.  The patient agrees to proceed with imaging.  Continue antiplatelet therapy as prescribed. Continue management of CAD, HTN and Hyperlipidemia. Healthy heart diet, encouraged exercise at least 4 times per week.

## 2019-10-10 ENCOUNTER — Ambulatory Visit
Admission: RE | Admit: 2019-10-10 | Discharge: 2019-10-10 | Disposition: A | Discharge status: Home / Self Care | Payer: Medicare Other | Source: Ambulatory Visit | Attending: Vascular Surgery | Admitting: Vascular Surgery

## 2019-10-10 ENCOUNTER — Other Ambulatory Visit: Payer: Self-pay

## 2019-10-10 DIAGNOSIS — I6523 Occlusion and stenosis of bilateral carotid arteries: Secondary | ICD-10-CM | POA: Diagnosis present

## 2019-10-10 LAB — POCT I-STAT CREATININE: Creatinine, Ser: 0.7 mg/dL (ref 0.44–1.00)

## 2019-10-10 MED ORDER — IOHEXOL 350 MG/ML SOLN
75.0000 mL | Freq: Once | INTRAVENOUS | Status: AC | PRN
  Administered 2019-10-10: 75 mL via INTRAVENOUS

## 2019-10-21 ENCOUNTER — Other Ambulatory Visit: Payer: Self-pay

## 2019-10-21 ENCOUNTER — Ambulatory Visit (INDEPENDENT_AMBULATORY_CARE_PROVIDER_SITE_OTHER): Payer: Medicare Other | Admitting: Vascular Surgery

## 2019-10-21 ENCOUNTER — Encounter (INDEPENDENT_AMBULATORY_CARE_PROVIDER_SITE_OTHER): Payer: Self-pay | Admitting: Vascular Surgery

## 2019-10-21 VITALS — BP 166/67 | HR 68 | Resp 16 | Wt 129.4 lb

## 2019-10-21 DIAGNOSIS — E119 Type 2 diabetes mellitus without complications: Secondary | ICD-10-CM

## 2019-10-21 DIAGNOSIS — I6523 Occlusion and stenosis of bilateral carotid arteries: Secondary | ICD-10-CM | POA: Diagnosis not present

## 2019-10-21 DIAGNOSIS — I10 Essential (primary) hypertension: Secondary | ICD-10-CM | POA: Diagnosis not present

## 2019-10-21 MED ORDER — CLOPIDOGREL BISULFATE 75 MG PO TABS: 75.0000 mg | ORAL_TABLET | Freq: Every day | ORAL | 6 refills | Status: DC

## 2019-10-21 NOTE — Patient Instructions (Signed)
Carotid Angioplasty With Stent Carotid angioplasty with stent is a procedure to open or widen an artery in the neck (carotid artery) that has become narrowed. This is done by inflating a small balloon inside the artery and then placing a small piece of metal that looks like a coil or spring (stent) inside the artery. The stent helps keep the artery open by supporting the artery walls. The carotid arteries supply blood to the brain. When fats, cholesterol, and other materials (plaque) build up in an artery, the artery becomes narrow and can become blocked. This can reduce or block blood flow to certain areas of the brain, which can cause serious health problems, including stroke. Tell a health care provider about:  Any allergies you have.  All medicines you are taking, including vitamins, herbs, eye drops, creams, and over-the-counter medicines.  Any problems you or family members have had with anesthetic medicines.  Any blood disorders you have.  Any surgeries you have had.  Any medical conditions you have.  Whether you are pregnant or may be pregnant. What are the risks? Generally, this is a safe procedure. However, problems may occur, including:  Infection.  Bleeding.  Allergic reactions to medicines or dyes.  Damage to other structures or organs, or to the carotid artery itself.  The carotid artery becoming blocked again.  A collection of blood under the skin (hematoma) around the stent site that gets larger.  A blood clot in another part of the body.  Kidney injury.  Stroke.  Heart attack. What happens before the procedure?  Ask your health care provider about: ? Changing or stopping your regular medicines. This is especially important if you are taking diabetes medicines or blood thinners. ? Whether aspirin is recommended before this procedure. ? Taking over-the-counter medicines, vitamins, herbs, and supplements.  Follow instructions from your health care provider  about eating or drinking restrictions.  Do not use any products that contain nicotine or tobacco for 4 weeks before the procedure. These products include cigarettes, e-cigarettes, and chewing tobacco. If you need help quitting, ask your health care provider.  Ask your health care provider what steps will be taken to help prevent infection. These may include: ? Removing hair at the surgery site. ? Washing skin with a germ-killing soap. ? Taking antibiotic medicine.  You may have blood tests and imaging tests done.  Plan to have someone take you home from the hospital or clinic.  If you will be going home right after the procedure, plan to have someone with you for 24 hours. What happens during the procedure?   An IV will be inserted into one of your veins.  You may be given one or more of the following: ? A medicine to help you relax (sedative). ? A medicine to numb the area where the catheter will be inserted (local anesthetic).  Most commonly, an incision will be made in your groin. In some cases, an incision may be made in your wrist or forearm instead of your groin.  A small, thin tube (catheter) will be inserted through your incision, into an artery. The catheter will be threaded upward into your carotid artery. An X-ray machine (fluoroscope) will help your health care provider guide the catheter to the correct place in your artery.  Dye will be injected into the catheter and will travel to the narrow or blocked part of your carotid artery.  X-ray images will be taken of how the dye flows through your artery. While the images   are being taken, you may be given instructions about breathing, swallowing, moving, or talking.  A filter (distal protection device) will be inserted into your artery. This will be used to catch plaque that comes loose in your artery during the procedure. This reduces the risk of plaque moving into your brain.  A small balloon will be inserted into your  artery. The balloon will be inflated for a few seconds to widen your artery and will then be removed.  The stent will be placed in your artery.  A second small balloon will be inserted into your artery and inflated. This expands the stent inside of your artery so that the stent holds up the artery walls. The balloon will then be removed.  The catheter and the distal protection device will be removed from your artery.  Your incision may be closed with stitches (sutures), skin glue, or adhesive tape.  A bandage (dressing) will be placed over your incision. The procedure may vary among health care providers and hospitals. What happens after the procedure?  Your blood pressure, heart rate, breathing rate, and blood oxygen level will be monitored until you leave the hospital or clinic.  You may continue to receive fluids and medicines through an IV.  You may need to have pressure placed on the incision site to prevent bleeding.  You will need to keep the area still for a few hours, or as long as directed by your health care provider. If the procedure was done in the groin, you will be instructed not to bend or cross your legs.  You may have some pain. Pain medicines will be available to help you.  You may have a test that uses sound waves to take pictures (ultrasound) of the carotid artery. This can be compared to future tests to check for changes in the artery.  Do not drive for 24 hours. Summary  Carotid angioplasty with stent is a procedure to open or widen an artery in the neck (carotid artery) that has become narrowed.  The procedure is done to lower the risk of problems that can result from reduced blood flow to the brain, including a stroke.  The stent placed inside the artery will help keep the artery open by supporting the artery walls.  Follow instructions from your health care provider about taking medicines and about eating and drinking before the procedure. This  information is not intended to replace advice given to you by your health care provider. Make sure you discuss any questions you have with your health care provider. Document Revised: 02/19/2018 Document Reviewed: 02/14/2018 Elsevier Patient Education  2020 [employer]  

## 2019-10-21 NOTE — Assessment & Plan Note (Signed)
blood glucose control important in reducing the progression of atherosclerotic disease. Also, involved in wound healing. On appropriate medications.  

## 2019-10-21 NOTE — Progress Notes (Signed)
MRN : [id number]  Sheena Parker is a 69 y.o. (1950-05-27) female who presents with chief complaint of  Chief Complaint  Patient presents with   Follow-up    ct results  .  History of Present Illness: Patient returns today in follow up of her carotid disease.  She has had no symptoms or problems since her last visit.  Since her last visit, she has undergone a CT angiogram of the neck which I have independently reviewed.  The official report of this is of a 60% stenosis bilaterally.  My interpretation of the study would be a significantly worse stenosis on the left closer to 80% with the right side being in the 70-75% range.  Current Outpatient Medications  Medication Sig Dispense Refill   acetaminophen (TYLENOL) 325 MG tablet Take 650 mg by mouth every 6 (six) hours as needed for mild pain or headache.      aspirin EC 81 MG tablet Take 81 mg by mouth daily.     HYDROcodone-acetaminophen (NORCO) 5-325 MG tablet Take 1-2 tablets by mouth every 4 (four) hours as needed for moderate pain. 30 tablet 0   levothyroxine (SYNTHROID, LEVOTHROID) 50 MCG tablet Take 50 mcg by mouth daily before breakfast.     lisinopril (PRINIVIL,ZESTRIL) 10 MG tablet Take 10 mg by mouth every morning.      lisinopril (ZESTRIL) 20 MG tablet Take 1 tablet by mouth once daily 90 tablet 0   losartan-hydrochlorothiazide (HYZAAR) 100-12.5 MG tablet Take 1 tablet by mouth daily.     metFORMIN (GLUCOPHAGE) 500 MG tablet Take 500 mg by mouth 2 (two) times daily with a meal.     No current facility-administered medications for this visit.    Past Medical History:  Diagnosis Date   Arthritis    KNEE   Diabetes mellitus without complication ([place])    Difficult intubation    GERD (gastroesophageal reflux disease)    NO MEDS   Hyperlipidemia    Hypertension    Hypothyroidism    Thyroid disease     Past Surgical History:  Procedure Laterality Date   CHOLECYSTECTOMY N/A 11/14/2016   Procedure:  LAPAROSCOPIC CHOLECYSTECTOMY WITH INTRAOPERATIVE CHOLANGIOGRAM;  Surgeon: Robert Bellow, MD;  Location: [facility] OR[facility];  Service: General;  Laterality: N/A;   COLONOSCOPY WITH PROPOFOL N/A 03/17/2019   Procedure: COLONOSCOPY WITH PROPOFOL;  Surgeon: Virgel Manifold, MD;  Location: [facility] END[facility]COPY;  Service: Endoscopy;  Laterality: N/A;   NO PAST SURGERIES       Social History   Tobacco Use   Smoking status: Never Smoker   Smokeless tobacco: Never Used  Vaping Use   Vaping Use: Never used  Substance Use Topics   Alcohol use: No   Drug use: No     Family History  Problem Relation Age of Onset   Breast cancer Mother 84   Breast cancer Sister 25   Breast cancer Sister 31   Breast cancer Sister 52   Colon cancer Neg Hx     No Known Allergies   REVIEW OF SYSTEMS (Negative unless checked)  Constitutional: [] Weight loss  [] Fever  [] Chills Cardiac: [] Chest pain   [] Chest pressure   [] Palpitations   [] Shortness of breath when laying flat   [] Shortness of breath at rest   [] Shortness of breath with exertion. Vascular:  [] Pain in legs with walking   [] Pain in legs at rest   [] Pain in legs when laying flat   [] Claudication   [] Pain in feet when walking  []   Pain in feet at rest  [] Pain in feet when laying flat   [] History of DVT   [] Phlebitis   [] Swelling in legs   [] Varicose veins   [] Non-healing ulcers Pulmonary:   [] Uses home oxygen   [] Productive cough   [] Hemoptysis   [] Wheeze  [] COPD   [] Asthma Neurologic:  [] Dizziness  [] Blackouts   [] Seizures   [] History of stroke   [] History of TIA  [] Aphasia   [] Temporary blindness   [] Dysphagia   [] Weakness or numbness in arms   [] Weakness or numbness in legs Musculoskeletal:  [x] Arthritis   [] Joint swelling   [x] Joint pain   [] Low back pain Hematologic:  [] Easy bruising  [] Easy bleeding   [] Hypercoagulable state   [] Anemic   Gastrointestinal:  [] Blood in stool   [] Vomiting blood  [] Gastroesophageal reflux/heartburn   [] Abdominal  pain Genitourinary:  [] Chronic kidney disease   [] Difficult urination  [] Frequent urination  [] Burning with urination   [] Hematuria Skin:  [] Rashes   [] Ulcers   [] Wounds Psychological:  [] History of anxiety   []  History of major depression.  Physical Examination  BP (!) 166/67 (BP Location: Right Arm)    Pulse 68    Resp 16    Wt 129 lb 6.4 oz (58.7 kg)    BMI 25.27 kg/m  Gen:  WD/WN, NAD Head: [place]/AT, No temporalis wasting. Ear/Nose/Throat: Hearing grossly intact, nares w/o erythema or drainage Eyes: Conjunctiva clear. Sclera non-icteric Neck: Supple.  Trachea midline Pulmonary:  Good air movement, no use of accessory muscles.  Cardiac: RRR, no JVD Vascular:  Vessel Right Left  Radial Palpable Palpable                           Musculoskeletal: M/S 5/5 throughout.  No deformity or atrophy. No edema. Neurologic: Sensation grossly intact in extremities.  Symmetrical.  Speech is fluent.  Psychiatric: Judgment intact, Mood & affect appropriate for pt's clinical situation. Dermatologic: No rashes or ulcers noted.  No cellulitis or open wounds.       Labs Recent Results (from the past 2160 hour(s))  I-STAT creatinine     Status: None   Collection Time: 10/10/19  2:27 PM  Result Value Ref Range   Creatinine, Ser 0.70 0.44 - 1.00 mg/dL    Radiology CT Angio Neck W/Cm &/Or Wo/Cm  Result Date: 10/11/2019 CLINICAL DATA:  Left carotid bruit.  Carotid stenosis by sonography. EXAM: CT ANGIOGRAPHY NECK TECHNIQUE: Multidetector CT imaging of the neck was performed using the standard protocol during bolus administration of intravenous contrast. Multiplanar CT image reconstructions and MIPs were obtained to evaluate the vascular anatomy. Carotid stenosis measurements (when applicable) are obtained utilizing NASCET criteria, using the distal internal carotid diameter as the denominator. CONTRAST:  11mL OMNIPAQUE IOHEXOL 350 MG/ML SOLN COMPARISON:  None. FINDINGS: Aortic arch: Aortic  atherosclerosis. Branching pattern is normal without origin stenosis. Right carotid system: Common carotid artery is widely patent to the bifurcation region. There is extensive soft and calcified plaque at the carotid bifurcation and ICA bulb. Minimal diameter in the proximal ICA bulb is 1.7 mm. Compared to a more distal cervical ICA diameter of 4 mm, this indicates a 60% stenosis. 50% ECA stenosis. Left carotid system: Common carotid artery widely patent to the bifurcation. Extensive soft plaque at the carotid bifurcation and ICA bulb region. Minimal diameter of the ICA is also 1.7 mm on this side. Compared to a more distal cervical ICA diameter of 4 mm, this indicates a  60% stenosis. 70% ECA stenosis. Vertebral arteries: Both vertebral artery origins are widely patent. No stenosis greater than 30%. Beyond that, both vertebral arteries appear normal through the cervical regions, through the foramen magnum. Left vertebral artery terminates in PICA. Right vertebral artery supplies the basilar. Skeleton: Normal Other neck: No mass or lymphadenopathy.  Normal Upper chest: Normal IMPRESSION: Aortic atherosclerosis. 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 Minimal diameter of the internal carotid artery on both sides is 1.7 mm, indicating a 60% stenosis when compared to the more distal cervical ICA diameter of 4 mm on each side. Some irregularity could increase relative risk for embolic disease. Electronically Signed   By: Nelson Chimes M.D.   On: 10/11/2019 13:53   VAS US CAROTID  Result Date: 09/30/2019 Carotid Arterial Duplex Study Indications: Left bruit. Performing Technologist: Charlane Ferretti RT (R)(VS)  Examination Guidelines: A complete evaluation includes B-mode imaging, spectral Doppler, color Doppler, and power Doppler as needed of all accessible portions of each vessel. Bilateral testing is considered an integral part of a complete examination. Limited examinations for  reoccurring indications may be performed as noted.  Right Carotid Findings: +----------+--------+--------+--------+------------------+--------------------+             PSV cm/s EDV cm/s Stenosis Plaque Description Comments              +----------+--------+--------+--------+------------------+--------------------+  CCA Prox   79       14                                   intimal thickening    +----------+--------+--------+--------+------------------+--------------------+  CCA Mid    60       13                                   intimal thickening    +----------+--------+--------+--------+------------------+--------------------+  CCA Distal 106      20                calcific           intimal thickening    +----------+--------+--------+--------+------------------+--------------------+  ICA Prox   165      14                calcific           ICA/CCA ratio = 3.40  +----------+--------+--------+--------+------------------+--------------------+  ICA Mid    268      58                                                         +----------+--------+--------+--------+------------------+--------------------+  ICA Distal 98       25                                                         +----------+--------+--------+--------+------------------+--------------------+  ECA        269      25                calcific                                 +----------+--------+--------+--------+------------------+--------------------+ +----------+--------+-------+------------+-------------------+  PSV cm/s EDV cms Describe     Arm Pressure (mmHG)  +----------+--------+-------+------------+-------------------+  Subclavian 100              Mulktiphasic                      +----------+--------+-------+------------+-------------------+ +---------+--------+---+--------+--+---------+  Vertebral PSV cm/s 123 EDV cm/s 26 Antegrade  +---------+--------+---+--------+--+---------+ Left Carotid Findings:  +----------+--------+--------+--------+------------------+-------------------+             PSV cm/s EDV cm/s Stenosis Plaque Description Comments             +----------+--------+--------+--------+------------------+-------------------+  CCA Prox   79       17                                                        +----------+--------+--------+--------+------------------+-------------------+  CCA Mid    60       15                                   intimal thickening   +----------+--------+--------+--------+------------------+-------------------+  CCA Distal 347      54                                   intimal thickening   +----------+--------+--------+--------+------------------+-------------------+  ICA Prox   400      53                calcific           ICA/CCA ratio = 5.1  +----------+--------+--------+--------+------------------+-------------------+  ICA Mid    252      48                                                        +----------+--------+--------+--------+------------------+-------------------+  ICA Distal 125      41                                                        +----------+--------+--------+--------+------------------+-------------------+  ECA        407      37                                                        +----------+--------+--------+--------+------------------+-------------------+ +----------+--------+--------+-----------+-------------------+             PSV cm/s EDV cm/s Describe    Arm Pressure (mmHG)  +----------+--------+--------+-----------+-------------------+  Subclavian 185               Multiphasic                      +----------+--------+--------+-----------+-------------------+ +---------+--------+--+--------+--+---------+  Vertebral PSV cm/s 83 EDV cm/s 13  Antegrade  +---------+--------+--+--------+--+---------+ Summary: Right Carotid: Velocities in the right ICA are consistent with a 60-79%                stenosis. The ECA appears >50% stenosed. Left Carotid:  Velocities in the left ICA are consistent with a 80-99% stenosis.               The ECA appears >50% stenosed. Vertebrals:  Bilateral vertebral arteries demonstrate antegrade flow. Subclavians: Normal flow hemodynamics were seen in bilateral subclavian              arteries. *See table(s) above for measurements and observations.  Electronically signed by Leotis Pain MD on 09/30/2019 at 8:48:08 AM.    Final     Assessment/Plan  No problem-specific Assessment & Plan notes found for this encounter.    Leotis Pain, MD  10/21/2019 3:45 PM    This note was created with Dragon medical transcription system.  Any errors from dictation are purely unintentional

## 2019-10-21 NOTE — Assessment & Plan Note (Signed)
blood pressure control important in reducing the progression of atherosclerotic disease. On appropriate oral medications.  

## 2019-10-21 NOTE — Assessment & Plan Note (Addendum)
she has undergone a CT angiogram of the neck which I have independently reviewed.  The official report of this is of a 60% stenosis bilaterally.  My interpretation of the study would be a significantly worse stenosis on the left closer to 80% with the right side being in the 70-75% range. I had a long discussion with she and her son today who provides much of the history.  Given the differing findings between duplex and CT scan, we generally would then perform a catheter-based angiogram for full evaluation of the lesion.  Given her anatomy, if indeed high-grade stenosis is seen on the left, during this angiogram proceeding with carotid stent placement would be a reasonable option.  I would likely evaluate both carotids prior to determining whether or not to proceed with treatment.  I have discussed this in detail with she and her son.  I discussed the risks and benefits of the procedure.  Have discussed the alternatives which would be medical therapy with close observation versus surgical therapy.  They would like to proceed with angiography and possible stent placement at that time.  This will be scheduled for next week.  I will send in a prescription for Plavix to be started 1 week prior to the procedure.

## 2019-10-22 ENCOUNTER — Telehealth (INDEPENDENT_AMBULATORY_CARE_PROVIDER_SITE_OTHER): Payer: Self-pay

## 2019-10-22 NOTE — Telephone Encounter (Signed)
I attempted to contact the patient to give her the information regarding her procedure with Dr. Lucky Cowboy on 10/27/19 and was told by the husband that she is working and they will call back. Patient is scheduled with Dr. Lucky Cowboy for a left carotid stent placement on 10/30/19 with a 8:15 am arrival time to the [facility]. Patient will do covid testing on 10/28/19 between 8-1 pm at the [facility]. This is the date that was established with Dr. Lucky Cowboy at the patient's appt.

## 2019-10-24 ENCOUNTER — Encounter (INDEPENDENT_AMBULATORY_CARE_PROVIDER_SITE_OTHER): Payer: Self-pay

## 2019-10-24 ENCOUNTER — Telehealth (INDEPENDENT_AMBULATORY_CARE_PROVIDER_SITE_OTHER): Payer: Self-pay

## 2019-10-24 ENCOUNTER — Ambulatory Visit (INDEPENDENT_AMBULATORY_CARE_PROVIDER_SITE_OTHER): Payer: Medicare Other | Admitting: Vascular Surgery

## 2019-10-24 NOTE — Telephone Encounter (Signed)
Patient arrival time has changed from 8:15 am to 9:00 am.

## 2019-10-24 NOTE — Telephone Encounter (Signed)
As the patient and Dr. Lucky Cowboy agreed on 10/30/19 I will send the paperwork out to the patient.

## 2019-10-28 ENCOUNTER — Other Ambulatory Visit
Admission: RE | Admit: 2019-10-28 | Discharge: 2019-10-28 | Disposition: A | Discharge status: Home / Self Care | Payer: Medicare Other | Source: Ambulatory Visit | Attending: Vascular Surgery | Admitting: Vascular Surgery

## 2019-10-28 ENCOUNTER — Other Ambulatory Visit: Payer: Self-pay

## 2019-10-28 LAB — SARS CORONAVIRUS 2 (TAT 6-24 HRS): SARS Coronavirus 2: NEGATIVE

## 2019-10-30 ENCOUNTER — Other Ambulatory Visit (INDEPENDENT_AMBULATORY_CARE_PROVIDER_SITE_OTHER): Payer: Self-pay | Admitting: Nurse Practitioner

## 2019-10-30 ENCOUNTER — Encounter: Payer: Self-pay | Admitting: Vascular Surgery

## 2019-10-30 ENCOUNTER — Inpatient Hospital Stay
Admission: RE | Admit: 2019-10-30 | Discharge: 2019-10-31 | DRG: 036 | Disposition: A | Discharge status: Home / Self Care | Payer: Medicare Other | Attending: Vascular Surgery | Admitting: Vascular Surgery

## 2019-10-30 ENCOUNTER — Encounter
Admission: RE | Disposition: A | Discharge status: Home / Self Care | Payer: Self-pay | Source: Home / Self Care | Attending: Vascular Surgery

## 2019-10-30 ENCOUNTER — Other Ambulatory Visit: Payer: Self-pay

## 2019-10-30 DIAGNOSIS — Z20822 Contact with and (suspected) exposure to covid-19: Secondary | ICD-10-CM | POA: Diagnosis present

## 2019-10-30 DIAGNOSIS — I6529 Occlusion and stenosis of unspecified carotid artery: Secondary | ICD-10-CM | POA: Diagnosis present

## 2019-10-30 DIAGNOSIS — I10 Essential (primary) hypertension: Secondary | ICD-10-CM | POA: Diagnosis present

## 2019-10-30 DIAGNOSIS — E039 Hypothyroidism, unspecified: Secondary | ICD-10-CM | POA: Diagnosis present

## 2019-10-30 DIAGNOSIS — E119 Type 2 diabetes mellitus without complications: Secondary | ICD-10-CM | POA: Diagnosis present

## 2019-10-30 DIAGNOSIS — E785 Hyperlipidemia, unspecified: Secondary | ICD-10-CM | POA: Diagnosis present

## 2019-10-30 DIAGNOSIS — I6522 Occlusion and stenosis of left carotid artery: Secondary | ICD-10-CM | POA: Diagnosis present

## 2019-10-30 HISTORY — PX: CAROTID PTA/STENT INTERVENTION: CATH118231

## 2019-10-30 LAB — GLUCOSE, CAPILLARY
Glucose-Capillary: 133 mg/dL — ABNORMAL HIGH (ref 70–99)
Glucose-Capillary: 105 mg/dL — ABNORMAL HIGH (ref 70–99)

## 2019-10-30 LAB — MRSA PCR SCREENING: MRSA by PCR: NEGATIVE

## 2019-10-30 LAB — CREATININE, SERUM
Creatinine, Ser: 0.71 mg/dL (ref 0.44–1.00)
GFR calc Af Amer: >60 mL/min (ref >60)
GFR calc non Af Amer: >60 mL/min (ref >60)

## 2019-10-30 LAB — POCT ACTIVATED CLOTTING TIME: Activated Clotting Time: 301 seconds

## 2019-10-30 LAB — BUN: BUN: 16 mg/dL (ref 8–23)

## 2019-10-30 SURGERY — CAROTID PTA/STENT INTERVENTION
Anesthesia: Moderate Sedation | Laterality: Left

## 2019-10-30 MED ORDER — CEFAZOLIN SODIUM-DEXTROSE 2-4 GM/100ML-% IV SOLN
2.0000 g | Freq: Once | INTRAVENOUS | Status: AC
  Administered 2019-10-30: 2 g via INTRAVENOUS

## 2019-10-30 MED ORDER — MAGNESIUM SULFATE 2 GM/50ML IV SOLN: 2.0000 g | Freq: Every day | INTRAVENOUS | Status: DC | PRN

## 2019-10-30 MED ORDER — SODIUM CHLORIDE 0.9 % IV SOLN: 500.0000 mL | Freq: Once | INTRAVENOUS | Status: DC | PRN

## 2019-10-30 MED ORDER — HEPARIN SODIUM (PORCINE) 1000 UNIT/ML IJ SOLN
INTRAMUSCULAR | Status: DC | PRN
  Administered 2019-10-30: 7000 [IU] via INTRAVENOUS

## 2019-10-30 MED ORDER — PHENOL 1.4 % MT LIQD
1.0000 | OROMUCOSAL | Status: DC | PRN
  Filled 2019-10-30: qty 177

## 2019-10-30 MED ORDER — OXYCODONE-ACETAMINOPHEN 5-325 MG PO TABS: 1.0000 | ORAL_TABLET | ORAL | Status: DC | PRN

## 2019-10-30 MED ORDER — HYDROMORPHONE HCL 1 MG/ML IJ SOLN: 1.0000 mg | Freq: Once | INTRAMUSCULAR | Status: DC | PRN

## 2019-10-30 MED ORDER — FENTANYL CITRATE (PF) 100 MCG/2ML IJ SOLN
INTRAMUSCULAR | Status: AC
  Filled 2019-10-30: qty 2

## 2019-10-30 MED ORDER — ACETAMINOPHEN 325 MG PO TABS: 650.0000 mg | ORAL_TABLET | Freq: Four times a day (QID) | ORAL | Status: DC | PRN

## 2019-10-30 MED ORDER — MIDAZOLAM HCL 2 MG/ML PO SYRP: 8.0000 mg | ORAL_SOLUTION | Freq: Once | ORAL | Status: DC | PRN

## 2019-10-30 MED ORDER — CLOPIDOGREL BISULFATE 75 MG PO TABS
75.0000 mg | ORAL_TABLET | Freq: Every day | ORAL | Status: DC
  Administered 2019-10-30: 75 mg via ORAL
  Filled 2019-10-30: qty 1

## 2019-10-30 MED ORDER — HYDRALAZINE HCL 20 MG/ML IJ SOLN: 5.0000 mg | INTRAMUSCULAR | Status: DC | PRN

## 2019-10-30 MED ORDER — DIPHENHYDRAMINE HCL 50 MG/ML IJ SOLN: 50.0000 mg | Freq: Once | INTRAMUSCULAR | Status: DC | PRN

## 2019-10-30 MED ORDER — METOPROLOL TARTRATE 5 MG/5ML IV SOLN: 2.0000 mg | INTRAVENOUS | Status: DC | PRN

## 2019-10-30 MED ORDER — MIDAZOLAM HCL 5 MG/5ML IJ SOLN
INTRAMUSCULAR | Status: AC
  Filled 2019-10-30: qty 5

## 2019-10-30 MED ORDER — MIDAZOLAM HCL 2 MG/2ML IJ SOLN
INTRAMUSCULAR | Status: DC | PRN
  Administered 2019-10-30: 2 mg via INTRAVENOUS

## 2019-10-30 MED ORDER — ATORVASTATIN CALCIUM 10 MG PO TABS
10.0000 mg | ORAL_TABLET | Freq: Every day | ORAL | Status: DC
  Administered 2019-10-30 – 2019-10-31 (×2): 10 mg via ORAL
  Filled 2019-10-30 (×2): qty 1

## 2019-10-30 MED ORDER — ATROPINE SULFATE 1 MG/10ML IJ SOSY
PREFILLED_SYRINGE | INTRAMUSCULAR | Status: AC
  Filled 2019-10-30: qty 10

## 2019-10-30 MED ORDER — HEPARIN SODIUM (PORCINE) 1000 UNIT/ML IJ SOLN
INTRAMUSCULAR | Status: AC
  Filled 2019-10-30: qty 1

## 2019-10-30 MED ORDER — METHYLPREDNISOLONE SODIUM SUCC 125 MG IJ SOLR: 125.0000 mg | Freq: Once | INTRAMUSCULAR | Status: DC | PRN

## 2019-10-30 MED ORDER — ALUM & MAG HYDROXIDE-SIMETH 200-200-20 MG/5ML PO SUSP: 15.0000 mL | ORAL | Status: DC | PRN

## 2019-10-30 MED ORDER — POTASSIUM CHLORIDE CRYS ER 20 MEQ PO TBCR: 20.0000 meq | EXTENDED_RELEASE_TABLET | Freq: Every day | ORAL | Status: DC | PRN

## 2019-10-30 MED ORDER — DOPAMINE-DEXTROSE 3.2-5 MG/ML-% IV SOLN: 4.0000 ug/kg/min | INTRAVENOUS | Status: DC

## 2019-10-30 MED ORDER — LABETALOL HCL 5 MG/ML IV SOLN: 10.0000 mg | INTRAVENOUS | Status: DC | PRN

## 2019-10-30 MED ORDER — SODIUM CHLORIDE 0.9 % IV SOLN
INTRAVENOUS | Status: DC
  Administered 2019-10-30: 1000 mL via INTRAVENOUS

## 2019-10-30 MED ORDER — PHENYLEPHRINE HCL (PRESSORS) 10 MG/ML IV SOLN
INTRAVENOUS | Status: AC
  Filled 2019-10-30: qty 1

## 2019-10-30 MED ORDER — SODIUM CHLORIDE 0.9 % IV SOLN: INTRAVENOUS | Status: DC

## 2019-10-30 MED ORDER — MORPHINE SULFATE (PF) 4 MG/ML IV SOLN: 2.0000 mg | INTRAVENOUS | Status: DC | PRN

## 2019-10-30 MED ORDER — HYDROCODONE-ACETAMINOPHEN 5-325 MG PO TABS: 1.0000 | ORAL_TABLET | ORAL | Status: DC | PRN

## 2019-10-30 MED ORDER — ACETAMINOPHEN 325 MG PO TABS: 325.0000 mg | ORAL_TABLET | ORAL | Status: DC | PRN

## 2019-10-30 MED ORDER — ACETAMINOPHEN 325 MG RE SUPP
325.0000 mg | RECTAL | Status: DC | PRN
  Filled 2019-10-30: qty 2

## 2019-10-30 MED ORDER — DOPAMINE-DEXTROSE 3.2-5 MG/ML-% IV SOLN
INTRAVENOUS | Status: AC
  Filled 2019-10-30: qty 250

## 2019-10-30 MED ORDER — LOSARTAN POTASSIUM-HCTZ 100-12.5 MG PO TABS: 1.0000 | ORAL_TABLET | Freq: Every day | ORAL | Status: DC

## 2019-10-30 MED ORDER — CHLORHEXIDINE GLUCONATE CLOTH 2 % EX PADS
6.0000 | MEDICATED_PAD | Freq: Every day | CUTANEOUS | Status: DC
  Administered 2019-10-30: 6 via TOPICAL

## 2019-10-30 MED ORDER — FAMOTIDINE 20 MG PO TABS: 40.0000 mg | ORAL_TABLET | Freq: Once | ORAL | Status: DC | PRN

## 2019-10-30 MED ORDER — FENTANYL CITRATE (PF) 100 MCG/2ML IJ SOLN
INTRAMUSCULAR | Status: DC | PRN
  Administered 2019-10-30: 50 ug via INTRAVENOUS

## 2019-10-30 MED ORDER — ONDANSETRON HCL 4 MG/2ML IJ SOLN: 4.0000 mg | Freq: Four times a day (QID) | INTRAMUSCULAR | Status: DC | PRN

## 2019-10-30 MED ORDER — ASPIRIN EC 81 MG PO TBEC
81.0000 mg | DELAYED_RELEASE_TABLET | Freq: Every day | ORAL | Status: DC
  Administered 2019-10-31: 81 mg via ORAL
  Filled 2019-10-30: qty 1

## 2019-10-30 MED ORDER — IODIXANOL 320 MG/ML IV SOLN
INTRAVENOUS | Status: DC | PRN
  Administered 2019-10-30: 75 mL via INTRA_ARTERIAL

## 2019-10-30 MED ORDER — LEVOTHYROXINE SODIUM 50 MCG PO TABS
50.0000 ug | ORAL_TABLET | Freq: Every day | ORAL | Status: DC
  Administered 2019-10-31: 50 ug via ORAL
  Filled 2019-10-30: qty 1

## 2019-10-30 MED ORDER — FAMOTIDINE IN NACL 20-0.9 MG/50ML-% IV SOLN
20.0000 mg | Freq: Two times a day (BID) | INTRAVENOUS | Status: DC
  Administered 2019-10-30 – 2019-10-31 (×2): 20 mg via INTRAVENOUS
  Filled 2019-10-30 (×2): qty 50

## 2019-10-30 MED ORDER — GUAIFENESIN-DM 100-10 MG/5ML PO SYRP: 15.0000 mL | ORAL_SOLUTION | ORAL | Status: DC | PRN

## 2019-10-30 MED ORDER — LISINOPRIL 5 MG PO TABS
10.0000 mg | ORAL_TABLET | ORAL | Status: DC
  Filled 2019-10-30: qty 2

## 2019-10-30 MED ORDER — ATROPINE SULFATE 1 MG/10ML IJ SOSY
PREFILLED_SYRINGE | INTRAMUSCULAR | Status: DC | PRN
  Administered 2019-10-30: 1 mg via INTRAVENOUS

## 2019-10-30 MED ORDER — CEFAZOLIN SODIUM-DEXTROSE 2-4 GM/100ML-% IV SOLN
2.0000 g | Freq: Three times a day (TID) | INTRAVENOUS | Status: AC
  Administered 2019-10-30 – 2019-10-31 (×2): 2 g via INTRAVENOUS
  Filled 2019-10-30 (×2): qty 100

## 2019-10-30 MED ORDER — LISINOPRIL 10 MG PO TABS
20.0000 mg | ORAL_TABLET | Freq: Every day | ORAL | Status: DC
  Administered 2019-10-31: 20 mg via ORAL
  Filled 2019-10-30: qty 2

## 2019-10-30 SURGICAL SUPPLY — 17 items
BALLN VIATRAC 5X30X135 (BALLOONS) ×2
BALLOON VIATRAC 5X30X135 (BALLOONS) ×1 IMPLANT
CATH ANGIO 5F 100CM .035 PIG (CATHETERS) ×2 IMPLANT
CATH BEACON 5 .035 100 SIM1 TP (CATHETERS) ×2 IMPLANT
CATH G 5FX100 (CATHETERS) ×2 IMPLANT
DEVICE EMBOSHIELD NAV6 4.0-7.0 (FILTER) ×2 IMPLANT
DEVICE PRESTO INFLATION (MISCELLANEOUS) ×2 IMPLANT
DEVICE STARCLOSE SE CLOSURE (Vascular Products) ×2 IMPLANT
DEVICE TORQUE .025-.038 (MISCELLANEOUS) ×2 IMPLANT
GLIDEWIRE ANGLED SS 035X260CM (WIRE) ×2 IMPLANT
KIT CAROTID MANIFOLD (MISCELLANEOUS) ×2 IMPLANT
PACK ANGIOGRAPHY (CUSTOM PROCEDURE TRAY) ×2 IMPLANT
SHEATH BRITE TIP 6FRX11 (SHEATH) ×2 IMPLANT
SHEATH SHUTTLE 6FR (SHEATH) ×2 IMPLANT
STENT XACT CAR 9-7X40X136 (Permanent Stent) ×2 IMPLANT
WIRE G VAS 035X260 STIFF (WIRE) ×2 IMPLANT
WIRE J 3MM .035X145CM (WIRE) ×2 IMPLANT

## 2019-10-30 NOTE — Progress Notes (Addendum)
Dr. Delana Meyer paged for patients HR dropping down to 38, not sustaining.

## 2019-10-30 NOTE — Progress Notes (Signed)
Blood pressure unstable and levophed drip titrated up to 40mcg °

## 2019-10-30 NOTE — Op Note (Signed)
OPERATIVE NOTE DATE: 10/30/2019  PROCEDURE: 1.  Ultrasound guidance for vascular access right femoral artery 2.  Placement of a 9 mm proximal, 7 mm distal, 4 cm Exact stent with the use of the NAV-6 embolic protection device in the left carotid artery  PRE-OPERATIVE DIAGNOSIS: 1.  High-grade left carotid artery stenosis. 2.  Differing findings between CT and ultrasound and degree of carotid stenosis  POST-OPERATIVE DIAGNOSIS:  Same as above  SURGEON: Leotis Pain, MD  ASSISTANT(S): Hortencia Pilar, MD  ANESTHESIA: local/MCS  ESTIMATED BLOOD LOSS: 30 cc  CONTRAST: 75 cc  FLUORO TIME: 9.6 minutes  MODERATE CONSCIOUS SEDATION TIME:  Approximately 30 minutes using 2 mg of Versed and 50 mcg of Fentanyl  FINDING(S): 1.   85% ulcerated left carotid artery stenosis  SPECIMEN(S):   none  INDICATIONS:   Patient is a 69 y.o. female who presents with high-grade left carotid artery stenosis.  The patient has an ultrasound showing clearly high-grade velocities with a CT scan that was officially read by the radiologist as a more moderate stenosis.  I disagreed with the radiologist assessment, and an angiogram is being performed both for diagnostic purposes as well as potential treatment if high-grade stenosis is indeed found.  Risks and benefits were discussed and informed consent was obtained.   DESCRIPTION: After obtaining full informed written consent, the patient was brought back to the vascular suite and placed supine upon the table.  The patient received IV antibiotics prior to induction. Moderate conscious sedation was administered during a face to face encounter with the patient throughout the procedure with my supervision of the RN administering medicines and monitoring the patients vital signs and mental status throughout from the start of the procedure until the patient was taken to the recovery room.  After obtaining adequate anesthesia, the patient was prepped and draped in the  standard fashion.   The right femoral artery was visualized with ultrasound and found to be widely patent. It was then accessed under direct ultrasound guidance without difficulty with a Seldinger needle. A permanent image was recorded. A J-wire was placed and we then placed a 6 French sheath. The patient was then heparinized and a total of 7000 units of intravenous heparin were given and an ACT was checked to confirm successful anticoagulation. A pigtail catheter was then placed into the ascending aorta. This showed a type III aortic arch with a bovine configuration. I then selectively cannulated the innominate artery and the left common carotid artery without difficulty with a Simmons 1 catheter and advanced into the mid left common carotid artery.  It was a little tedious due to the reverse curve from the type III arch, but we were able to get the [facility] catheter to track after advancing the Glidewire up into the external carotid artery.  Cervical and cerebral carotid angiography was then performed. There were no obvious intracranial filling defects with very poor flow in the anterior cerebral artery. The carotid bifurcation demonstrated an ulcerated 85% stenosis seen most on the lateral projection.  I then advanced into the external carotid artery with a Glidewire and the Simmons 1 catheter and then exchanged for the Amplatz Super Stiff wire. Over the Amplatz Super Stiff wire, a 6 [place] shuttle sheath was placed into the mid common carotid artery. I then used the NAV-6  Embolic protection device and crossed the lesion and parked this in the distal internal carotid artery at the base of the skull.  I then selected a 9 mm  proximal, 7 mm distal 4 cm long exact stent. This was deployed across the lesion encompassing it in its entirety. A 5 mm diameter by 30 mm length balloon was used to post dilate the stent. Only about a 10% residual stenosis was present after angioplasty. Completion angiogram showed normal  intracranial filling without new defects.  There was much more brisk filling of the left anterior cerebral artery.  At this point I elected to terminate the procedure. The sheath was removed and StarClose closure device was deployed in the right femoral artery with excellent hemostatic result. The patient was taken to the recovery room in stable condition having tolerated the procedure well.  COMPLICATIONS: none  CONDITION: stable  Leotis Pain 10/30/2019 1:25 PM   This note was created with Dragon Medical transcription system. Any errors in dictation are purely unintentional.

## 2019-10-30 NOTE — H&P (Signed)
[facility] History & Physical Update  The patient was interviewed and re-examined.  The patient's previous History and Physical has been reviewed and is unchanged.  There is no change in the plan of care. We plan to proceed with the scheduled procedure.  Leotis Pain, MD  10/30/2019, 8:36 AM

## 2019-10-30 NOTE — Progress Notes (Deleted)
Patient accepted from ED. Patient intubated and sedated with fentanyl.  Blood pressure unstable upon arrival and levophed drip increased to 30mcg.  Assessment complete, see flowsheets.   

## 2019-10-31 ENCOUNTER — Encounter: Payer: Self-pay | Admitting: Vascular Surgery

## 2019-10-31 DIAGNOSIS — I6522 Occlusion and stenosis of left carotid artery: Principal | ICD-10-CM

## 2019-10-31 LAB — CBC
HCT: 30.5 % — ABNORMAL LOW (ref 36.0–46.0)
Hemoglobin: 10.8 g/dL — ABNORMAL LOW (ref 12.0–15.0)
MCH: 30.3 pg (ref 26.0–34.0)
MCHC: 35.4 g/dL (ref 30.0–36.0)
MCV: 85.7 fL (ref 80.0–100.0)
Platelets: 208 10*3/uL (ref 150–400)
RBC: 3.56 MIL/uL — ABNORMAL LOW (ref 3.87–5.11)
RDW: 12.3 % (ref 11.5–15.5)
WBC: 7.4 10*3/uL (ref 4.0–10.5)
nRBC: 0 % (ref 0.0–0.2)

## 2019-10-31 LAB — BASIC METABOLIC PANEL
Anion gap: 5 (ref 5–15)
BUN: 13 mg/dL (ref 8–23)
CO2: 24 mmol/L (ref 22–32)
Calcium: 8.1 mg/dL — ABNORMAL LOW (ref 8.9–10.3)
Chloride: 101 mmol/L (ref 98–111)
Creatinine, Ser: 0.67 mg/dL (ref 0.44–1.00)
GFR calc Af Amer: >60 mL/min (ref >60)
GFR calc non Af Amer: >60 mL/min (ref >60)
Glucose, Bld: 130 mg/dL — ABNORMAL HIGH (ref 70–99)
Potassium: 4.3 mmol/L (ref 3.5–5.1)
Sodium: 130 mmol/L — ABNORMAL LOW (ref 135–145)

## 2019-10-31 LAB — GLUCOSE, CAPILLARY: Glucose-Capillary: 108 mg/dL — ABNORMAL HIGH (ref 70–99)

## 2019-10-31 MED ORDER — HYDROCHLOROTHIAZIDE 12.5 MG PO CAPS
12.5000 mg | ORAL_CAPSULE | Freq: Every day | ORAL | Status: DC
  Filled 2019-10-31: qty 1

## 2019-10-31 MED ORDER — LOSARTAN POTASSIUM 50 MG PO TABS: 100.0000 mg | ORAL_TABLET | Freq: Every day | ORAL | Status: DC

## 2019-10-31 MED ORDER — LISINOPRIL 5 MG PO TABS: 10.0000 mg | ORAL_TABLET | Freq: Every day | ORAL | Status: DC

## 2019-10-31 NOTE — Discharge Summary (Signed)
[facility]    Discharge Summary  Patient ID:  Sheena Parker MRN: [id number] DOB/AGE: [birth year] 69 y.o.  Admit date: 10/30/2019 Discharge date: 10/31/2019 Date of Surgery: 10/30/2019 Surgeon: Surgeon(s): Dew, Erskine Squibb, MD Schnier, Dolores Lory, MD  Admission Diagnosis: Carotid stenosis [I65.29]  Discharge Diagnoses:  Carotid stenosis [I65.29]  Secondary Diagnoses: Past Medical History:  Diagnosis Date  . Arthritis    KNEE  . Diabetes mellitus without complication ([place])   . Difficult intubation   . GERD (gastroesophageal reflux disease)    NO MEDS  . Hyperlipidemia   . Hypertension   . Hypothyroidism   . Thyroid disease    Procedure(s): 10/30/19: 1.  Ultrasound guidance for vascular access right femoral artery 2.  Placement of a 9 mm proximal, 7 mm distal, 4 cm Exact stent with the use of the NAV-6 embolic protection device in the left carotid artery  Discharged Condition: Good  HPI / Hospital Course:  Patient is a 69 year old female who presents with high-grade left carotid artery stenosis. On 10/30/19, the patient underwent:  1.  Ultrasound guidance for vascular access right femoral artery 2.  Placement of a 9 mm proximal, 7 mm distal, 4 cm Exact stent with the use of the NAV-6 embolic protection device in the left carotid artery  The patient tolerated the procedure well was transferred from the angiography suite to the ICU for observation overnight.  Patient's night of surgery was unremarkable.  During the patient's brief inpatient stay, her diet was advanced her Foley was removed her discomfort was treated with p.o. pain medication and she was ambulating at baseline.  Day of discharge, the patient was afebrile with stable vital signs.  Physical exam:  Alert and oriented x3, No acute distress Face: Symmetrical.  Tongue is midline Neck: Trachea midline.  No swelling or ecchymosis noted Cardiovascular: Regular rate and rhythm Pulmonary: Clear to  auscultation bilaterally Abdomen: Soft, nontender, nondistended, bowel sounds (+) Bilateral groins: No swelling, no drainage.  Clean dry intact. Extremities: Warm distally toes. Neuro: Intact  Labs: As below  Complications: None  Consults: None  Significant Diagnostic Studies: CBC Lab Results  Component Value Date   WBC 7.4 10/31/2019   HGB 10.8 (L) 10/31/2019   HCT 30.5 (L) 10/31/2019   MCV 85.7 10/31/2019   PLT 208 10/31/2019   BMET    Component Value Date/Time   NA 130 (L) 10/31/2019 0543   K 4.3 10/31/2019 0543   CL 101 10/31/2019 0543   CO2 24 10/31/2019 0543   GLUCOSE 130 (H) 10/31/2019 0543   BUN 13 10/31/2019 0543   CREATININE 0.67 10/31/2019 0543   CALCIUM 8.1 (L) 10/31/2019 0543   GFRNONAA >60 10/31/2019 0543   GFRAA >60 10/31/2019 0543   COAG No results found for: INR, PROTIME  Disposition:  Discharge to :Home  Allergies as of 10/31/2019   No Known Allergies     Medication List    STOP taking these medications   HYDROcodone-acetaminophen 5-325 MG tablet Commonly known as: Norco   lisinopril 20 MG tablet Commonly known as: ZESTRIL     TAKE these medications   acetaminophen 325 MG tablet Commonly known as: TYLENOL Take 650 mg by mouth every 6 (six) hours as needed for mild pain or headache.   aspirin EC 81 MG tablet Take 81 mg by mouth daily.   atorvastatin 20 MG tablet Commonly known as: LIPITOR Take 20 mg by mouth at bedtime.   clopidogrel 75 MG tablet  Commonly known as: PLAVIX Take 1 tablet (75 mg total) by mouth daily.   levothyroxine 50 MCG tablet Commonly known as: SYNTHROID Take 50 mcg by mouth daily before breakfast.   losartan-hydrochlorothiazide 100-12.5 MG tablet Commonly known as: HYZAAR Take 1 tablet by mouth daily.   metFORMIN 500 MG tablet Commonly known as: GLUCOPHAGE Take 500 mg by mouth 2 (two) times daily with a meal.      Verbal and written Discharge instructions given to the patient. Wound care per  Discharge AVS  Follow-up Information    Dew, Erskine Squibb, MD Follow up in 1 month(s).   Specialties: Vascular Surgery, Radiology, Interventional Cardiology Why: Can see Dew or Arna Medici. Carotid Duplex with visit.  Contact information: [place] [address] [phone]              Signed: Sela Hua, PA-C  10/31/2019, 1:12 PM

## 2019-10-31 NOTE — Progress Notes (Signed)
Patient HR sustained in the 40's and BP stable this shift. Dopamine gtt not needed per parameters given by Dr. Delana Meyer. Day shift RN advised of dopamine gtt order and parameters given verbally by Dr. Delana Meyer.

## 2019-10-31 NOTE — Progress Notes (Signed)
Pt discharged home at this time. Discharge education given to and reviewed with patient. No questions at this time. VSS prior to discharge.

## 2019-10-31 NOTE — Discharge Instructions (Signed)
Carotid Artery Disease  Carotid artery disease is the narrowing or blockage of one or both carotid arteries. This condition is also called carotid artery stenosis. The carotid arteries are the two main blood vessels on either side of the neck. They send blood to the brain, other parts of the head, and the neck.  This condition increases your risk for a stroke or a transient ischemic attack (TIA). A TIA is a "mini-stroke" that causes stroke-like symptoms that go away quickly. What are the causes? This condition is mainly caused by a narrowing and hardening of the carotid arteries. The carotid arteries can become narrow or clogged with a buildup of plaque. Plaque includes:  Fat.  Cholesterol.  Calcium.  Other substances. What increases the risk? The following factors may make you more likely to develop this condition:  Having certain medical conditions, such as: ? High cholesterol. ? High blood pressure. ? Diabetes. ? Obesity.  Smoking.  A family history of cardiovascular disease.  Not being active or lack of regular exercise.  Being female. Men have a higher risk of having arteries become narrow and harden earlier in life than women.  Old age. What are the signs or symptoms? This condition may not have any signs or symptoms until a stroke or TIA happens. In some cases, your doctor may be able to hear a whooshing sound. This can suggest a change in blood flow caused by plaque buildup. An eye exam can also help find signs of the condition. How is this treated? This condition may be treated with more than one treatment. Treatment options include:  Lifestyle changes, such as: ? Quitting smoking. ? Getting regular exercise, or getting exercise as told by your doctor. ? Eating a healthy diet. ? Managing stress. ? Keeping a healthy weight.  Medicines to control: ? Blood pressure. ? Cholesterol. ? Blood clotting.  Surgery. You may have: ? A surgery to remove the blockages  in the carotid arteries. ? A procedure in which a small mesh tube (stent) is used to widen the blocked carotid arteries. Follow these instructions at home: Eating and drinking Follow instructions about your diet from your doctor. It is important to follow a healthy diet.  Eat a diet that includes: ? A lot of fresh fruits and vegetables. ? Low-fat (lean) meats.  Avoid these foods: ? Foods that are high in fat. ? Foods that are high in salt (sodium). ? Foods that are fried. ? Foods that are processed. ? Foods that have few good nutrients (poor nutritional value).  Lifestyle   Keep a healthy weight.  Do exercises as told by your doctor to stay active. Each week, you should get one of the following: ? At least 150 minutes of exercise that raises your heart rate and makes you sweat (moderate-intensity exercise). ? At least 75 minutes of exercise that takes a lot of effort.  Do not use any products that contain nicotine or tobacco, such as cigarettes, e-cigarettes, and chewing tobacco. If you need help quitting, ask your doctor.  Do not drink alcohol if: ? Your doctor tells you not to drink. ? You are pregnant, may be pregnant, or are planning to become pregnant.  If you drink alcohol: ? Limit how much you use to:  0-1 drink a day for women.  0-2 drinks a day for men. ? Be aware of how much alcohol is in your drink. In the [place] one drink equals one 12 oz bottle of beer (355 mL), one  5 oz glass of wine (148 mL), or one 1 oz glass of hard liquor (44 mL).  Do not use drugs.  Manage your stress. Ask your doctor for tips on how to do this. General instructions  Take over-the-counter and prescription medicines only as told by your doctor.  Keep all follow-up visits as told by your doctor. This is important. Where to find more information  American Heart Association: www.heart.org Get help right away if:  You have any signs of a stroke. "BE FAST" is an easy way to remember  the main warning signs: ? B - Balance. Signs are dizziness, sudden trouble walking, or loss of balance. ? E - Eyes. Signs are trouble seeing or a change in how you see. ? F - Face. Signs are sudden weakness or loss of feeling of the face, or the face or eyelid drooping on one side. ? A - Arms. Signs are weakness or loss of feeling in an arm. This happens suddenly and usually on one side of the body. ? S - Speech. Signs are sudden trouble speaking, slurred speech, or trouble understanding what people say. ? T - Time. Time to call emergency services. Write down what time symptoms started.  You have other signs of a stroke, such as: ? A sudden, very bad headache with no known cause. ? Feeling like you may vomit (nausea). ? Vomiting. ? A seizure. These symptoms may be an emergency. Do not wait to see if the symptoms will go away. Get medical help right away. Call your local emergency services (911 in the [place] Do not drive yourself to the hospital. Summary  The carotid arteries are blood vessels on both sides of the neck.  If these arteries get smaller or get blocked, you are more likely to have a stroke or a mini-stroke.  This condition can be treated with lifestyle changes, medicines, surgery, or a blend of these treatments.  Get help right away if you have any signs of a stroke. "BE FAST" is an easy way to remember the main warning signs of stroke. This information is not intended to replace advice given to you by your health care provider. Make sure you discuss any questions you have with your health care provider. Document Revised: 11/18/2018 Document Reviewed: 11/18/2018 Elsevier Patient Education  [place]. Vascular Surgery Discharge Instructions  1) You may shower as of tomorrow. Keep your groins clean and dry. 2) No heavy lifting greater than 10 pounds for approximately 2 weeks.

## 2019-10-31 NOTE — Progress Notes (Signed)
Spoke with Dr Delana Meyer, HR sustaining in the 40's BP 125/55. Dopamine gtt order entered for HR sustained in the 30's with hypotension.

## 2019-11-23 ENCOUNTER — Other Ambulatory Visit: Payer: Self-pay | Admitting: Internal Medicine

## 2019-12-01 ENCOUNTER — Ambulatory Visit (INDEPENDENT_AMBULATORY_CARE_PROVIDER_SITE_OTHER): Payer: Medicare Other

## 2019-12-01 ENCOUNTER — Other Ambulatory Visit (INDEPENDENT_AMBULATORY_CARE_PROVIDER_SITE_OTHER): Payer: Self-pay | Admitting: Vascular Surgery

## 2019-12-01 ENCOUNTER — Ambulatory Visit (INDEPENDENT_AMBULATORY_CARE_PROVIDER_SITE_OTHER): Payer: Medicare Other | Admitting: Nurse Practitioner

## 2019-12-01 ENCOUNTER — Other Ambulatory Visit: Payer: Self-pay

## 2019-12-01 ENCOUNTER — Encounter (INDEPENDENT_AMBULATORY_CARE_PROVIDER_SITE_OTHER): Payer: Self-pay | Admitting: Nurse Practitioner

## 2019-12-01 VITALS — BP 170/70 | HR 53 | Resp 15 | Wt 125.0 lb

## 2019-12-01 DIAGNOSIS — Z959 Presence of cardiac and vascular implant and graft, unspecified: Secondary | ICD-10-CM

## 2019-12-01 DIAGNOSIS — I6522 Occlusion and stenosis of left carotid artery: Secondary | ICD-10-CM

## 2019-12-01 DIAGNOSIS — I6523 Occlusion and stenosis of bilateral carotid arteries: Secondary | ICD-10-CM | POA: Diagnosis not present

## 2019-12-01 DIAGNOSIS — E119 Type 2 diabetes mellitus without complications: Secondary | ICD-10-CM

## 2019-12-01 DIAGNOSIS — I10 Essential (primary) hypertension: Secondary | ICD-10-CM | POA: Diagnosis not present

## 2019-12-01 NOTE — Progress Notes (Signed)
Subjective:    Patient ID: Sheena Parker, female    DOB: [birth year], 69 y.o.   MRN: [id number] Chief Complaint  Patient presents with  . Follow-up    [facility] 40month post left carotid stenosis    The patient is seen for follow up evaluation of carotid stenosis status post left carotid stent placement on 10/30/2019.  There were no post operative problems or complications related to the surgery.  The patient denies neck or incisional pain.  The patient denies interval amaurosis fugax. There is no recent history of TIA symptoms or focal motor deficits. There is no prior documented CVA.  The patient denies headache.  She does note that she has been feeling a little more tired however no focal weakness.  The patient is taking enteric-coated aspirin 81 mg daily.  The patient has a history of coronary artery disease, no recent episodes of angina or shortness of breath. The patient denies PAD or claudication symptoms. There is a history of hyperlipidemia which is being treated with a statin.   Today noninvasive studies show a 60 to 79% stenosis of the right ICA.  The left ICA shows a 1 to 39% stenosis.  The bilateral vertebral arteries show antegrade flow with normal flow hemodynamics in the bilateral subclavian arteries.   Review of Systems  Musculoskeletal: Positive for neck stiffness.  Neurological: Positive for weakness.  All other systems reviewed and are negative.      Objective:   Physical Exam Vitals reviewed.  HENT:     Head: Normocephalic.  Cardiovascular:     Rate and Rhythm: Normal rate and regular rhythm.     Pulses: Normal pulses.     Heart sounds: Normal heart sounds.  Pulmonary:     Effort: Pulmonary effort is normal.     Breath sounds: Normal breath sounds.  Skin:    General: Skin is warm and dry.  Neurological:     Mental Status: She is alert and oriented to person, place, and time.  Psychiatric:        Mood and Affect: Mood normal.        Behavior: Behavior  normal.        Thought Content: Thought content normal.        Judgment: Judgment normal.     BP (!) 170/70 (BP Location: Right Arm)   Pulse 53   Resp 15   Wt 125 lb (56.7 kg)   BMI 24.41 kg/m   Past Medical History:  Diagnosis Date  . Arthritis    KNEE  . Diabetes mellitus without complication ([place])   . Difficult intubation   . GERD (gastroesophageal reflux disease)    NO MEDS  . Hyperlipidemia   . Hypertension   . Hypothyroidism   . Thyroid disease     Social History   Socioeconomic History  . Marital status: Married    Spouse name: Not on file  . Number of children: Not on file  . Years of education: Not on file  . Highest education level: Not on file  Occupational History  . Not on file  Tobacco Use  . Smoking status: Never Smoker  . Smokeless tobacco: Never Used  Vaping Use  . Vaping Use: Never used  Substance and Sexual Activity  . Alcohol use: No  . Drug use: No  . Sexual activity: Not on file  Other Topics Concern  . Not on file  Social History Narrative  . Not on file   Social Determinants of  Health   Financial Resource Strain:   . Difficulty of Paying Living Expenses:   Food Insecurity:   . Worried About Charity fundraiser in the Last Year:   . Arboriculturist in the Last Year:   Transportation Needs:   . Film/video editor (Medical):   Marland Kitchen Lack of Transportation (Non-Medical):   Physical Activity:   . Days of Exercise per Week:   . Minutes of Exercise per Session:   Stress:   . Feeling of Stress :   Social Connections:   . Frequency of Communication with Friends and Family:   . Frequency of Social Gatherings with Friends and Family:   . Attends Religious Services:   . Active Member of Clubs or Organizations:   . Attends Archivist Meetings:   Marland Kitchen Marital Status:   Intimate Partner Violence:   . Fear of Current or Ex-Partner:   . Emotionally Abused:   Marland Kitchen Physically Abused:   . Sexually Abused:     Past Surgical  History:  Procedure Laterality Date  . CAROTID PTA/STENT INTERVENTION Left 10/30/2019   Procedure: CAROTID PTA/STENT INTERVENTION;  Surgeon: Algernon Huxley, MD;  Location: [facility];  Service: Cardiovascular;  Laterality: Left;  . CHOLECYSTECTOMY N/A 11/14/2016   Procedure: LAPAROSCOPIC CHOLECYSTECTOMY WITH INTRAOPERATIVE CHOLANGIOGRAM;  Surgeon: Robert Bellow, MD;  Location: [facility];  Service: General;  Laterality: N/A;  . COLONOSCOPY WITH PROPOFOL N/A 03/17/2019   Procedure: COLONOSCOPY WITH PROPOFOL;  Surgeon: Virgel Manifold, MD;  Location: [facility];  Service: Endoscopy;  Laterality: N/A;  . NO PAST SURGERIES      Family History  Problem Relation Age of Onset  . Breast cancer Mother 89  . Breast cancer Sister 6  . Breast cancer Sister 5  . Breast cancer Sister 7  . Colon cancer Neg Hx     No Known Allergies     Assessment & Plan:   1. Bilateral carotid artery stenosis Recommend:  The patient is s/p successful left ICA stent  Duplex ultrasound preoperatively shows 60 to 79% contralateral stenosis.  Left ICA suggesting 1 to 39% stenosis.  Continue antiplatelet therapy as prescribed Continue management of CAD, HTN and Hyperlipidemia Healthy heart diet,  encouraged exercise at least 4 times per week  Follow up in 3 months with duplex ultrasound and physical exam based on the patient's carotid surgery and as well as contralateral stenosis.   2. Essential hypertension Continue antihypertensive medications as already ordered, these medications have been reviewed and there are no changes at this time.   3. Type 2 diabetes mellitus without complication, without long-term current use of insulin (HCC) Continue hypoglycemic medications as already ordered, these medications have been reviewed and there are no changes at this time.  Hgb A1C to be monitored as already arranged by primary service    Current Outpatient Medications on File Prior to Visit    Medication Sig Dispense Refill  . acetaminophen (TYLENOL) 325 MG tablet Take 650 mg by mouth every 6 (six) hours as needed for mild pain or headache.     Marland Kitchen aspirin EC 81 MG tablet Take 81 mg by mouth daily.    Marland Kitchen atorvastatin (LIPITOR) 20 MG tablet Take 20 mg by mouth at bedtime.    . clopidogrel (PLAVIX) 75 MG tablet Take 1 tablet (75 mg total) by mouth daily. 30 tablet 6  . levothyroxine (SYNTHROID, LEVOTHROID) 50 MCG tablet Take 50 mcg by mouth daily before breakfast.    .  lisinopril (ZESTRIL) 20 MG tablet Take 1 tablet by mouth once daily 90 tablet 0  . losartan-hydrochlorothiazide (HYZAAR) 100-12.5 MG tablet Take 1 tablet by mouth daily.    . metFORMIN (GLUCOPHAGE) 500 MG tablet Take 500 mg by mouth 2 (two) times daily with a meal.     No current facility-administered medications on file prior to visit.    There are no Patient Instructions on file for this visit. No follow-ups on file.   Kris Hartmann, NP

## 2019-12-08 ENCOUNTER — Other Ambulatory Visit: Payer: Self-pay | Admitting: *Deleted

## 2019-12-08 MED ORDER — ALENDRONATE SODIUM 70 MG PO TABS
70.0000 mg | ORAL_TABLET | ORAL | 11 refills | Status: DC
Start: 2019-12-08 — End: 2020-10-12

## 2019-12-08 MED ORDER — VITAMIN D 125 MCG (5000 UT) PO CAPS: ORAL_CAPSULE | ORAL | 11 refills | Status: AC

## 2019-12-10 ENCOUNTER — Encounter: Payer: Self-pay | Admitting: Ophthalmology

## 2019-12-11 ENCOUNTER — Encounter: Payer: Self-pay | Admitting: Ophthalmology

## 2019-12-11 ENCOUNTER — Other Ambulatory Visit: Payer: Self-pay

## 2019-12-11 NOTE — Discharge Instructions (Signed)

## 2019-12-12 ENCOUNTER — Other Ambulatory Visit
Admission: RE | Admit: 2019-12-12 | Discharge: 2019-12-12 | Disposition: A | Discharge status: Home / Self Care | Payer: Medicare Other | Source: Ambulatory Visit | Attending: Ophthalmology | Admitting: Ophthalmology

## 2019-12-12 DIAGNOSIS — Z20822 Contact with and (suspected) exposure to covid-19: Secondary | ICD-10-CM | POA: Diagnosis not present

## 2019-12-12 DIAGNOSIS — Z01812 Encounter for preprocedural laboratory examination: Secondary | ICD-10-CM | POA: Diagnosis present

## 2019-12-12 LAB — SARS CORONAVIRUS 2 (TAT 6-24 HRS): SARS Coronavirus 2: NEGATIVE

## 2019-12-16 ENCOUNTER — Ambulatory Visit: Payer: Medicare Other | Admitting: Anesthesiology

## 2019-12-16 ENCOUNTER — Ambulatory Visit
Admission: RE | Admit: 2019-12-16 | Discharge: 2019-12-16 | Disposition: A | Discharge status: Home / Self Care | Payer: Medicare Other | Attending: Ophthalmology | Admitting: Ophthalmology

## 2019-12-16 ENCOUNTER — Encounter: Payer: Self-pay | Admitting: Ophthalmology

## 2019-12-16 ENCOUNTER — Encounter
Admission: RE | Disposition: A | Discharge status: Home / Self Care | Payer: Self-pay | Source: Home / Self Care | Attending: Ophthalmology

## 2019-12-16 ENCOUNTER — Other Ambulatory Visit: Payer: Self-pay

## 2019-12-16 DIAGNOSIS — I10 Essential (primary) hypertension: Secondary | ICD-10-CM | POA: Diagnosis not present

## 2019-12-16 DIAGNOSIS — Z7982 Long term (current) use of aspirin: Secondary | ICD-10-CM | POA: Insufficient documentation

## 2019-12-16 DIAGNOSIS — Z79890 Hormone replacement therapy (postmenopausal): Secondary | ICD-10-CM | POA: Insufficient documentation

## 2019-12-16 DIAGNOSIS — E039 Hypothyroidism, unspecified: Secondary | ICD-10-CM | POA: Insufficient documentation

## 2019-12-16 DIAGNOSIS — Z79899 Other long term (current) drug therapy: Secondary | ICD-10-CM | POA: Insufficient documentation

## 2019-12-16 DIAGNOSIS — E1151 Type 2 diabetes mellitus with diabetic peripheral angiopathy without gangrene: Secondary | ICD-10-CM | POA: Insufficient documentation

## 2019-12-16 DIAGNOSIS — E1136 Type 2 diabetes mellitus with diabetic cataract: Secondary | ICD-10-CM | POA: Insufficient documentation

## 2019-12-16 DIAGNOSIS — Z7902 Long term (current) use of antithrombotics/antiplatelets: Secondary | ICD-10-CM | POA: Insufficient documentation

## 2019-12-16 DIAGNOSIS — I2510 Atherosclerotic heart disease of native coronary artery without angina pectoris: Secondary | ICD-10-CM | POA: Diagnosis not present

## 2019-12-16 DIAGNOSIS — E7800 Pure hypercholesterolemia, unspecified: Secondary | ICD-10-CM | POA: Diagnosis not present

## 2019-12-16 DIAGNOSIS — Z7984 Long term (current) use of oral hypoglycemic drugs: Secondary | ICD-10-CM | POA: Insufficient documentation

## 2019-12-16 DIAGNOSIS — H2511 Age-related nuclear cataract, right eye: Secondary | ICD-10-CM | POA: Insufficient documentation

## 2019-12-16 HISTORY — DX: Presence of dental prosthetic device (complete) (partial): Z97.2

## 2019-12-16 HISTORY — PX: CATARACT EXTRACTION W/PHACO: SHX586

## 2019-12-16 LAB — GLUCOSE, CAPILLARY
Glucose-Capillary: 133 mg/dL — ABNORMAL HIGH (ref 70–99)
Glucose-Capillary: 144 mg/dL — ABNORMAL HIGH (ref 70–99)

## 2019-12-16 SURGERY — PHACOEMULSIFICATION, CATARACT, WITH IOL INSERTION
Anesthesia: Monitor Anesthesia Care | Site: Eye | Laterality: Right

## 2019-12-16 MED ORDER — TETRACAINE HCL 0.5 % OP SOLN
1.0000 [drp] | OPHTHALMIC | Status: DC | PRN
  Administered 2019-12-16 (×3): 1 [drp] via OPHTHALMIC

## 2019-12-16 MED ORDER — MIDAZOLAM HCL 2 MG/2ML IJ SOLN
INTRAMUSCULAR | Status: DC | PRN
  Administered 2019-12-16: 1 mg via INTRAVENOUS

## 2019-12-16 MED ORDER — ARMC OPHTHALMIC DILATING DROPS
1.0000 "application " | OPHTHALMIC | Status: DC | PRN
  Administered 2019-12-16 (×3): 1 via OPHTHALMIC

## 2019-12-16 MED ORDER — FENTANYL CITRATE (PF) 100 MCG/2ML IJ SOLN
INTRAMUSCULAR | Status: DC | PRN
  Administered 2019-12-16: 50 ug via INTRAVENOUS

## 2019-12-16 MED ORDER — NA CHONDROIT SULF-NA HYALURON 40-17 MG/ML IO SOLN
INTRAOCULAR | Status: DC | PRN
  Administered 2019-12-16: 1 mL via INTRAOCULAR

## 2019-12-16 MED ORDER — MOXIFLOXACIN HCL 0.5 % OP SOLN
OPHTHALMIC | Status: DC | PRN
  Administered 2019-12-16: 0.2 mL via OPHTHALMIC

## 2019-12-16 MED ORDER — BRIMONIDINE TARTRATE-TIMOLOL 0.2-0.5 % OP SOLN
OPHTHALMIC | Status: DC | PRN
  Administered 2019-12-16: 1 [drp] via OPHTHALMIC

## 2019-12-16 MED ORDER — EPINEPHRINE PF 1 MG/ML IJ SOLN
INTRAOCULAR | Status: DC | PRN
  Administered 2019-12-16: 46 mL via OPHTHALMIC

## 2019-12-16 MED ORDER — LIDOCAINE HCL (PF) 2 % IJ SOLN
INTRAOCULAR | Status: DC | PRN
  Administered 2019-12-16: 1 mL

## 2019-12-16 SURGICAL SUPPLY — 18 items
CANNULA ANT/CHMB 27GA (MISCELLANEOUS) ×4 IMPLANT
GLOVE SURG LX 8.0 MICRO (GLOVE) ×1
GLOVE SURG LX STRL 8.0 MICRO (GLOVE) ×1 IMPLANT
GLOVE SURG TRIUMPH 8.0 PF LTX (GLOVE) ×2 IMPLANT
GOWN STRL REUS W/ TWL LRG LVL3 (GOWN DISPOSABLE) ×2 IMPLANT
GOWN STRL REUS W/TWL LRG LVL3 (GOWN DISPOSABLE) ×4
LENS IOL DIOP 25.0 (Intraocular Lens) ×2 IMPLANT
LENS IOL TECNIS MONO 25.0 (Intraocular Lens) ×1 IMPLANT
MARKER SKIN DUAL TIP RULER LAB (MISCELLANEOUS) ×2 IMPLANT
NEEDLE FILTER BLUNT 18X 1/2SAF (NEEDLE) ×1
NEEDLE FILTER BLUNT 18X1 1/2 (NEEDLE) ×1 IMPLANT
PACK EYE AFTER SURG (MISCELLANEOUS) ×2 IMPLANT
PACK OPTHALMIC (MISCELLANEOUS) ×2 IMPLANT
PACK PORFILIO (MISCELLANEOUS) ×2 IMPLANT
SYR 3ML LL SCALE MARK (SYRINGE) ×2 IMPLANT
SYR TB 1ML LUER SLIP (SYRINGE) ×2 IMPLANT
WATER STERILE IRR 250ML POUR (IV SOLUTION) ×2 IMPLANT
WIPE NON LINTING 3.25X3.25 (MISCELLANEOUS) ×2 IMPLANT

## 2019-12-16 NOTE — Transfer of Care (Signed)
Immediate Anesthesia Transfer of Care Note  Patient: Sheena Parker  Procedure(s) Performed: CATARACT EXTRACTION PHACO AND INTRAOCULAR LENS PLACEMENT (IOC) RIGHT DIABETIC 7.57 00:55.0 (Right Eye)  Patient Location: PACU  Anesthesia Type: MAC  Level of Consciousness: awake, alert  and patient cooperative  Airway and Oxygen Therapy: Patient Spontanous Breathing and Patient connected to supplemental oxygen  Post-op Assessment: Post-op Vital signs reviewed, Patient's Cardiovascular Status Stable, Respiratory Function Stable, Patent Airway and No signs of Nausea or vomiting  Post-op Vital Signs: Reviewed and stable  Complications: No complications documented.

## 2019-12-16 NOTE — Anesthesia Preprocedure Evaluation (Addendum)
Anesthesia Evaluation  Patient identified by MRN, date of birth, ID band Patient awake    History of Anesthesia Complications (+) history of anesthetic complications (prior colonoscopy record pre-op states difficult airway, but easy airway documentation during GETA in 2018.)  Airway Mallampati: II  TM Distance: >3 FB Neck ROM: Full    Dental  (+)    Pulmonary neg pulmonary ROS,    Pulmonary exam normal        Cardiovascular hypertension, Pt. on medications + Peripheral Vascular Disease (carotid stent placed 10/30/19, on Plavix)  Normal cardiovascular exam     Neuro/Psych negative neurological ROS     GI/Hepatic negative GI ROS, Neg liver ROS,   Endo/Other  diabetesHypothyroidism   Renal/GU negative Renal ROS     Musculoskeletal   Abdominal   Peds  Hematology   Anesthesia Other Findings   Reproductive/Obstetrics                            Anesthesia Physical Anesthesia Plan  ASA: III  Anesthesia Plan: MAC   Post-op Pain Management:    Induction: Intravenous  PONV Risk Score and Plan: 2  Airway Management Planned: Nasal Cannula and Natural Airway  Additional Equipment: None  Intra-op Plan:   Post-operative Plan:   Informed Consent: I have reviewed the patients History and Physical, chart, labs and discussed the procedure including the risks, benefits and alternatives for the proposed anesthesia with the patient or authorized representative who has indicated his/her understanding and acceptance.       Plan Discussed with: CRNA  Anesthesia Plan Comments:         Anesthesia Quick Evaluation

## 2019-12-16 NOTE — Anesthesia Procedure Notes (Signed)
Procedure Name: MAC Date/Time: 12/16/2019 8:31 AM Performed by: Cameron Ali, CRNA Pre-anesthesia Checklist: Patient identified, Emergency Drugs available, Suction available, Timeout performed and Patient being monitored Patient Re-evaluated:Patient Re-evaluated prior to induction Oxygen Delivery Method: Nasal cannula Placement Confirmation: positive ETCO2

## 2019-12-16 NOTE — Anesthesia Postprocedure Evaluation (Signed)
Anesthesia Post Note  Patient: Sheena Parker  Procedure(s) Performed: CATARACT EXTRACTION PHACO AND INTRAOCULAR LENS PLACEMENT (IOC) RIGHT DIABETIC 7.57 00:55.0 (Right Eye)     Patient location during evaluation: PACU Anesthesia Type: MAC Level of consciousness: awake and alert Pain management: pain level controlled Vital Signs Assessment: post-procedure vital signs reviewed and stable Respiratory status: spontaneous breathing Cardiovascular status: blood pressure returned to baseline Postop Assessment: no apparent nausea or vomiting, adequate PO intake and no headache Anesthetic complications: no   No complications documented.  Adele Barthel Guinevere Stephenson

## 2019-12-16 NOTE — H&P (Signed)
All labs reviewed. Abnormal studies sent to patients PCP when indicated.  Previous H&P reviewed, patient examined, there are NO CHANGES.  Sheena Cush Porfilio8/10/20218:25 AM

## 2019-12-16 NOTE — Op Note (Signed)
PREOPERATIVE DIAGNOSIS:  Nuclear sclerotic cataract of the right eye.   POSTOPERATIVE DIAGNOSIS:  H25.11 Cataract   OPERATIVE PROCEDURE:@   SURGEON:  Birder Robson, MD.   ANESTHESIA:  Anesthesiologist: Page, Adele Barthel, MD CRNA: Cameron Ali, CRNA  1.      Managed anesthesia care. 2.      0.74ml of Shugarcaine was instilled in the eye following the paracentesis.   COMPLICATIONS:  None.   TECHNIQUE:   Stop and chop   DESCRIPTION OF PROCEDURE:  The patient was examined and consented in the preoperative holding area where the aforementioned topical anesthesia was applied to the right eye and then brought back to the Operating Room where the right eye was prepped and draped in the usual sterile ophthalmic fashion and a lid speculum was placed. A paracentesis was created with the side port blade and the anterior chamber was filled with viscoelastic. A near clear corneal incision was performed with the steel keratome. A continuous curvilinear capsulorrhexis was performed with a cystotome followed by the capsulorrhexis forceps. Hydrodissection and hydrodelineation were carried out with BSS on a blunt cannula. The lens was removed in a stop and chop  technique and the remaining cortical material was removed with the irrigation-aspiration handpiece. The capsular bag was inflated with viscoelastic and the Technis [id number]  lens was placed in the capsular bag without complication. The remaining viscoelastic was removed from the eye with the irrigation-aspiration handpiece. The wounds were hydrated. The anterior chamber was flushed with BSS and the eye was inflated to physiologic pressure. 0.42ml of Vigamox was placed in the anterior chamber. The wounds were found to be water tight. The eye was dressed with Combigan. The patient was given protective glasses to wear throughout the day and a shield with which to sleep tonight. The patient was also given drops with which to begin a drop regimen today and will  follow-up with me in one day. Implant Name Type Inv. Item Serial No. Manufacturer Lot No. LRB No. Used Action  LENS IOL DIOP 25.0 - [id number] Intraocular Lens LENS IOL DIOP 25.0 [id number] [place]  Right 1 Implanted   Procedure(s) with comments: CATARACT EXTRACTION PHACO AND INTRAOCULAR LENS PLACEMENT (IOC) RIGHT DIABETIC 7.57 00:55.0 (Right) - Diabetic - oral meds  Electronically signed: Birder Robson 12/16/2019 8:48 AM

## 2019-12-19 ENCOUNTER — Other Ambulatory Visit: Payer: Self-pay

## 2019-12-19 MED ORDER — FREESTYLE LIBRE 14 DAY READER DEVI: 1.0000 | Freq: Two times a day (BID) | 3 refills | Status: AC

## 2019-12-19 MED ORDER — GLUCOSE BLOOD VI STRP: ORAL_STRIP | 12 refills | Status: DC

## 2020-01-02 ENCOUNTER — Other Ambulatory Visit: Payer: Self-pay

## 2020-01-02 ENCOUNTER — Other Ambulatory Visit
Admission: RE | Admit: 2020-01-02 | Discharge: 2020-01-02 | Disposition: A | Discharge status: Home / Self Care | Payer: Medicare Other | Source: Ambulatory Visit | Attending: Ophthalmology | Admitting: Ophthalmology

## 2020-01-02 DIAGNOSIS — Z01812 Encounter for preprocedural laboratory examination: Secondary | ICD-10-CM | POA: Insufficient documentation

## 2020-01-02 DIAGNOSIS — Z20822 Contact with and (suspected) exposure to covid-19: Secondary | ICD-10-CM | POA: Diagnosis not present

## 2020-01-02 LAB — SARS CORONAVIRUS 2 (TAT 6-24 HRS): SARS Coronavirus 2: NEGATIVE

## 2020-01-05 NOTE — Discharge Instructions (Signed)

## 2020-01-06 ENCOUNTER — Encounter: Payer: Self-pay | Admitting: Ophthalmology

## 2020-01-06 ENCOUNTER — Other Ambulatory Visit: Payer: Self-pay

## 2020-01-06 ENCOUNTER — Ambulatory Visit: Payer: Medicare Other | Admitting: Anesthesiology

## 2020-01-06 ENCOUNTER — Encounter
Admission: RE | Disposition: A | Discharge status: Home / Self Care | Payer: Self-pay | Source: Home / Self Care | Attending: Ophthalmology

## 2020-01-06 ENCOUNTER — Ambulatory Visit
Admission: RE | Admit: 2020-01-06 | Discharge: 2020-01-06 | Disposition: A | Discharge status: Home / Self Care | Payer: Medicare Other | Attending: Ophthalmology | Admitting: Ophthalmology

## 2020-01-06 DIAGNOSIS — E119 Type 2 diabetes mellitus without complications: Secondary | ICD-10-CM | POA: Insufficient documentation

## 2020-01-06 DIAGNOSIS — I10 Essential (primary) hypertension: Secondary | ICD-10-CM | POA: Insufficient documentation

## 2020-01-06 DIAGNOSIS — Z79899 Other long term (current) drug therapy: Secondary | ICD-10-CM | POA: Diagnosis not present

## 2020-01-06 DIAGNOSIS — Z7984 Long term (current) use of oral hypoglycemic drugs: Secondary | ICD-10-CM | POA: Insufficient documentation

## 2020-01-06 DIAGNOSIS — I2510 Atherosclerotic heart disease of native coronary artery without angina pectoris: Secondary | ICD-10-CM | POA: Insufficient documentation

## 2020-01-06 DIAGNOSIS — E7800 Pure hypercholesterolemia, unspecified: Secondary | ICD-10-CM | POA: Insufficient documentation

## 2020-01-06 DIAGNOSIS — E039 Hypothyroidism, unspecified: Secondary | ICD-10-CM | POA: Diagnosis not present

## 2020-01-06 DIAGNOSIS — Z9049 Acquired absence of other specified parts of digestive tract: Secondary | ICD-10-CM | POA: Diagnosis not present

## 2020-01-06 DIAGNOSIS — Z95828 Presence of other vascular implants and grafts: Secondary | ICD-10-CM | POA: Diagnosis not present

## 2020-01-06 DIAGNOSIS — H2512 Age-related nuclear cataract, left eye: Secondary | ICD-10-CM | POA: Diagnosis not present

## 2020-01-06 DIAGNOSIS — Z7982 Long term (current) use of aspirin: Secondary | ICD-10-CM | POA: Insufficient documentation

## 2020-01-06 HISTORY — PX: CATARACT EXTRACTION W/PHACO: SHX586

## 2020-01-06 LAB — GLUCOSE, CAPILLARY: Glucose-Capillary: 118 mg/dL — ABNORMAL HIGH (ref 70–99)

## 2020-01-06 SURGERY — PHACOEMULSIFICATION, CATARACT, WITH IOL INSERTION
Anesthesia: Monitor Anesthesia Care | Site: Eye | Laterality: Left

## 2020-01-06 MED ORDER — MIDAZOLAM HCL 2 MG/2ML IJ SOLN
INTRAMUSCULAR | Status: DC | PRN
  Administered 2020-01-06: 1 mg via INTRAVENOUS

## 2020-01-06 MED ORDER — LIDOCAINE HCL (PF) 2 % IJ SOLN
INTRAOCULAR | Status: DC | PRN
  Administered 2020-01-06: 1 mL

## 2020-01-06 MED ORDER — LACTATED RINGERS IV SOLN: INTRAVENOUS | Status: DC

## 2020-01-06 MED ORDER — BRIMONIDINE TARTRATE-TIMOLOL 0.2-0.5 % OP SOLN
OPHTHALMIC | Status: DC | PRN
  Administered 2020-01-06: 1 [drp] via OPHTHALMIC

## 2020-01-06 MED ORDER — ACETAMINOPHEN 325 MG PO TABS: 325.0000 mg | ORAL_TABLET | Freq: Once | ORAL | Status: DC

## 2020-01-06 MED ORDER — MOXIFLOXACIN HCL 0.5 % OP SOLN
OPHTHALMIC | Status: DC | PRN
  Administered 2020-01-06: 0.2 mL via OPHTHALMIC

## 2020-01-06 MED ORDER — ACETAMINOPHEN 160 MG/5ML PO SOLN: 325.0000 mg | Freq: Once | ORAL | Status: DC

## 2020-01-06 MED ORDER — ARMC OPHTHALMIC DILATING DROPS
1.0000 "application " | OPHTHALMIC | Status: DC | PRN
  Administered 2020-01-06 (×3): 1 via OPHTHALMIC

## 2020-01-06 MED ORDER — NA CHONDROIT SULF-NA HYALURON 40-17 MG/ML IO SOLN
INTRAOCULAR | Status: DC | PRN
  Administered 2020-01-06: 1 mL via INTRAOCULAR

## 2020-01-06 MED ORDER — FENTANYL CITRATE (PF) 100 MCG/2ML IJ SOLN
INTRAMUSCULAR | Status: DC | PRN
Start: 2020-01-06 — End: 2020-01-06
  Administered 2020-01-06: 50 ug via INTRAVENOUS

## 2020-01-06 MED ORDER — TETRACAINE HCL 0.5 % OP SOLN
1.0000 [drp] | OPHTHALMIC | Status: DC | PRN
  Administered 2020-01-06 (×3): 1 [drp] via OPHTHALMIC

## 2020-01-06 MED ORDER — EPINEPHRINE PF 1 MG/ML IJ SOLN
INTRAOCULAR | Status: DC | PRN
  Administered 2020-01-06: 40 mL via OPHTHALMIC

## 2020-01-06 SURGICAL SUPPLY — 20 items
CANNULA ANT/CHMB 27G (MISCELLANEOUS) ×2 IMPLANT
CANNULA ANT/CHMB 27GA (MISCELLANEOUS) ×4 IMPLANT
GLOVE SURG LX 8.0 MICRO (GLOVE) ×1
GLOVE SURG LX STRL 8.0 MICRO (GLOVE) ×1 IMPLANT
GLOVE SURG TRIUMPH 8.0 PF LTX (GLOVE) ×2 IMPLANT
GOWN STRL REUS W/ TWL LRG LVL3 (GOWN DISPOSABLE) ×2 IMPLANT
GOWN STRL REUS W/TWL LRG LVL3 (GOWN DISPOSABLE) ×4
LENS IOL DIOP 25.0 (Intraocular Lens) ×2 IMPLANT
LENS IOL TECNIS MONO 25.0 (Intraocular Lens) IMPLANT
MARKER SKIN DUAL TIP RULER LAB (MISCELLANEOUS) ×2 IMPLANT
NDL FILTER BLUNT 18X1 1/2 (NEEDLE) ×1 IMPLANT
NEEDLE FILTER BLUNT 18X 1/2SAF (NEEDLE) ×1
NEEDLE FILTER BLUNT 18X1 1/2 (NEEDLE) ×1 IMPLANT
PACK EYE AFTER SURG (MISCELLANEOUS) ×2 IMPLANT
PACK OPTHALMIC (MISCELLANEOUS) ×2 IMPLANT
PACK PORFILIO (MISCELLANEOUS) ×2 IMPLANT
SYR 3ML LL SCALE MARK (SYRINGE) ×2 IMPLANT
SYR TB 1ML LUER SLIP (SYRINGE) ×2 IMPLANT
WATER STERILE IRR 250ML POUR (IV SOLUTION) ×2 IMPLANT
WIPE NON LINTING 3.25X3.25 (MISCELLANEOUS) ×2 IMPLANT

## 2020-01-06 NOTE — H&P (Signed)
All labs reviewed. Abnormal studies sent to patients PCP when indicated.  Previous H&P reviewed, patient examined, there are NO CHANGES.  Sheena Parker Porfilio8/31/20217:52 AM

## 2020-01-06 NOTE — Op Note (Signed)
PREOPERATIVE DIAGNOSIS:  Nuclear sclerotic cataract of the left eye.   POSTOPERATIVE DIAGNOSIS:  Nuclear sclerotic cataract of the left eye.   OPERATIVE PROCEDURE:@   SURGEON:  Birder Robson, MD.   ANESTHESIA:  Anesthesiologist: Ronelle Nigh, MD CRNA: Jeannene Patella, CRNA  1.      Managed anesthesia care. 2.     0.72ml of Shugarcaine was instilled following the paracentesis   COMPLICATIONS:  None.   TECHNIQUE:   Stop and chop   DESCRIPTION OF PROCEDURE:  The patient was examined and consented in the preoperative holding area where the aforementioned topical anesthesia was applied to the left eye and then brought back to the Operating Room where the left eye was prepped and draped in the usual sterile ophthalmic fashion and a lid speculum was placed. A paracentesis was created with the side port blade and the anterior chamber was filled with viscoelastic. A near clear corneal incision was performed with the steel keratome. A continuous curvilinear capsulorrhexis was performed with a cystotome followed by the capsulorrhexis forceps. Hydrodissection and hydrodelineation were carried out with BSS on a blunt cannula. The lens was removed in a stop and chop  technique and the remaining cortical material was removed with the irrigation-aspiration handpiece. The capsular bag was inflated with viscoelastic and the Technis ZCB00 lens was placed in the capsular bag without complication. The remaining viscoelastic was removed from the eye with the irrigation-aspiration handpiece. The wounds were hydrated. The anterior chamber was flushed with BSS and the eye was inflated to physiologic pressure. 0.75ml Vigamox was placed in the anterior chamber. The wounds were found to be water tight. The eye was dressed with Combigan. The patient was given protective glasses to wear throughout the day and a shield with which to sleep tonight. The patient was also given drops with which to begin a drop regimen  today and will follow-up with me in one day. Implant Name Type Inv. Item Serial No. Manufacturer Lot No. LRB No. Used Action  LENS IOL DIOP 25.0 - [id number] Intraocular Lens LENS IOL DIOP 25.0 [id number] [employer]  Left 1 Implanted    Procedure(s): CATARACT EXTRACTION PHACO AND INTRAOCULAR LENS PLACEMENT (IOC) LEFT DIABETIC 7.12  00:44.5 (Left)  Electronically signed: Birder Robson 01/06/2020 8:14 AM

## 2020-01-06 NOTE — Anesthesia Postprocedure Evaluation (Signed)
Anesthesia Post Note  Patient: Sheena Parker  Procedure(s) Performed: CATARACT EXTRACTION PHACO AND INTRAOCULAR LENS PLACEMENT (IOC) LEFT DIABETIC 7.12  00:44.5 (Left Eye)     Patient location during evaluation: PACU Anesthesia Type: MAC Level of consciousness: awake and alert and oriented Pain management: satisfactory to patient Vital Signs Assessment: post-procedure vital signs reviewed and stable Respiratory status: spontaneous breathing, nonlabored ventilation and respiratory function stable Cardiovascular status: blood pressure returned to baseline and stable Postop Assessment: Adequate PO intake and No signs of nausea or vomiting Anesthetic complications: no   No complications documented.  Raliegh Ip

## 2020-01-06 NOTE — Anesthesia Procedure Notes (Signed)
Procedure Name: MAC Date/Time: 01/06/2020 8:02 AM Performed by: Jeannene Patella, CRNA Pre-anesthesia Checklist: Patient identified, Emergency Drugs available, Suction available, Timeout performed and Patient being monitored Patient Re-evaluated:Patient Re-evaluated prior to induction Oxygen Delivery Method: Nasal cannula Placement Confirmation: positive ETCO2

## 2020-01-06 NOTE — Transfer of Care (Addendum)
Immediate Anesthesia Transfer of Care Note  Patient: Sheena Parker  Procedure(s) Performed: CATARACT EXTRACTION PHACO AND INTRAOCULAR LENS PLACEMENT (IOC) LEFT DIABETIC (Left Eye)  Patient Location: PACU  Anesthesia Type: MAC  Level of Consciousness: awake, alert  and patient cooperative  Airway and Oxygen Therapy: Patient Spontanous Breathing and Patient connected to supplemental oxygen  Post-op Assessment: Post-op Vital signs reviewed, Patient's Cardiovascular Status Stable, Respiratory Function Stable, Patent Airway and No signs of Nausea or vomiting  Post-op Vital Signs: Reviewed and stable  Complications: No complications documented.

## 2020-01-06 NOTE — Anesthesia Preprocedure Evaluation (Addendum)
Anesthesia Evaluation  Patient identified by MRN, date of birth, ID band Patient awake    Reviewed: Allergy & Precautions, H&P , NPO status , Patient's Chart, lab work & pertinent test results  Airway Mallampati: II  TM Distance: >3 FB Neck ROM: full    Dental  (+) Missing, Loose   Pulmonary neg pulmonary ROS,    Pulmonary exam normal breath sounds clear to auscultation       Cardiovascular hypertension, + Peripheral Vascular Disease  Normal cardiovascular exam Rhythm:regular Rate:Normal     Neuro/Psych negative neurological ROS  negative psych ROS   GI/Hepatic Neg liver ROS, GERD  ,  Endo/Other  negative endocrine ROSdiabetes, Type 2Hypothyroidism   Renal/GU negative Renal ROS  negative genitourinary   Musculoskeletal negative musculoskeletal ROS (+)   Abdominal   Peds negative pediatric ROS (+)  Hematology negative hematology ROS (+)   Anesthesia Other Findings   Reproductive/Obstetrics negative OB ROS                            Anesthesia Physical Anesthesia Plan  ASA: III  Anesthesia Plan: MAC   Post-op Pain Management:    Induction:   PONV Risk Score and Plan: 2 and Treatment may vary due to age or medical condition, TIVA and Midazolam  Airway Management Planned:   Additional Equipment:   Intra-op Plan:   Post-operative Plan:   Informed Consent: I have reviewed the patients History and Physical, chart, labs and discussed the procedure including the risks, benefits and alternatives for the proposed anesthesia with the patient or authorized representative who has indicated his/her understanding and acceptance.     Dental Advisory Given  Plan Discussed with: CRNA  Anesthesia Plan Comments:         Anesthesia Quick Evaluation

## 2020-01-17 ENCOUNTER — Other Ambulatory Visit: Payer: Self-pay | Admitting: Internal Medicine

## 2020-02-04 ENCOUNTER — Other Ambulatory Visit (INDEPENDENT_AMBULATORY_CARE_PROVIDER_SITE_OTHER): Payer: Self-pay | Admitting: Nurse Practitioner

## 2020-02-05 ENCOUNTER — Other Ambulatory Visit: Payer: Self-pay | Admitting: *Deleted

## 2020-02-05 MED ORDER — GLUCOSE BLOOD VI STRP: ORAL_STRIP | 12 refills | Status: DC

## 2020-02-09 ENCOUNTER — Other Ambulatory Visit: Payer: Self-pay | Admitting: *Deleted

## 2020-02-09 MED ORDER — GLUCOSE BLOOD VI STRP: ORAL_STRIP | 12 refills | Status: DC

## 2020-02-13 ENCOUNTER — Other Ambulatory Visit: Payer: Self-pay | Admitting: *Deleted

## 2020-02-13 ENCOUNTER — Other Ambulatory Visit: Payer: Self-pay

## 2020-02-13 MED ORDER — GLUCOSE BLOOD VI STRP: ORAL_STRIP | 12 refills | Status: DC

## 2020-02-16 ENCOUNTER — Ambulatory Visit (INDEPENDENT_AMBULATORY_CARE_PROVIDER_SITE_OTHER): Payer: Medicare Other | Admitting: Internal Medicine

## 2020-02-16 ENCOUNTER — Other Ambulatory Visit: Payer: Self-pay

## 2020-02-16 DIAGNOSIS — I10 Essential (primary) hypertension: Secondary | ICD-10-CM

## 2020-02-16 DIAGNOSIS — R634 Abnormal weight loss: Secondary | ICD-10-CM

## 2020-02-16 DIAGNOSIS — D649 Anemia, unspecified: Secondary | ICD-10-CM | POA: Insufficient documentation

## 2020-02-16 DIAGNOSIS — Z23 Encounter for immunization: Secondary | ICD-10-CM

## 2020-02-16 DIAGNOSIS — E119 Type 2 diabetes mellitus without complications: Secondary | ICD-10-CM

## 2020-02-16 DIAGNOSIS — Z0000 Encounter for general adult medical examination without abnormal findings: Secondary | ICD-10-CM | POA: Diagnosis not present

## 2020-02-16 DIAGNOSIS — I6523 Occlusion and stenosis of bilateral carotid arteries: Secondary | ICD-10-CM

## 2020-02-16 HISTORY — DX: Abnormal weight loss: R63.4

## 2020-02-16 HISTORY — DX: Anemia, unspecified: D64.9

## 2020-02-16 MED ORDER — GLUCOSE BLOOD VI STRP: ORAL_STRIP | 12 refills | Status: DC

## 2020-02-16 NOTE — Progress Notes (Signed)
Established Patient Office Visit  SUBJECTIVE:  Subjective  Patient ID: Sheena Parker, female    DOB: [birth year]  Age: 69 y.o. MRN: [id number]  CC:  Chief Complaint  Patient presents with  . Annual Exam    HPI Sheena Parker is a 69 y.o. female presenting today for an annual exam.  She is taking her medications as directed and without any complication. She denies any missed doses.   She had a stent placed in her lt carotid artery about a month ago and has a 60% blockage on the lrt side.   More recently, she has had issues with fatigue and appetite loss. Her weight is relatively stable and is only down 2 lbs over the last several months.    Past Medical History:  Diagnosis Date  . Arthritis    KNEE, hands  . Diabetes mellitus without complication ([place])   . Difficult intubation   . GERD (gastroesophageal reflux disease)    NO MEDS  . Hyperlipidemia   . Hypertension   . Hypothyroidism   . Presence of dental prosthetic device    Dental implants,  top - front 2 teeth  . Thyroid disease     Past Surgical History:  Procedure Laterality Date  . CAROTID PTA/STENT INTERVENTION Left 10/30/2019   Procedure: CAROTID PTA/STENT INTERVENTION;  Surgeon: Algernon Huxley, MD;  Location: [facility];  Service: Cardiovascular;  Laterality: Left;  . CATARACT EXTRACTION W/PHACO Right 12/16/2019   Procedure: CATARACT EXTRACTION PHACO AND INTRAOCULAR LENS PLACEMENT (IOC) RIGHT DIABETIC 7.57 00:55.0;  Surgeon: Birder Robson, MD;  Location: [facility];  Service: Ophthalmology;  Laterality: Right;  Diabetic - oral meds  . CATARACT EXTRACTION W/PHACO Left 01/06/2020   Procedure: CATARACT EXTRACTION PHACO AND INTRAOCULAR LENS PLACEMENT (IOC) LEFT DIABETIC 7.12  00:44.5;  Surgeon: Birder Robson, MD;  Location: [facility];  Service: Ophthalmology;  Laterality: Left;  . CHOLECYSTECTOMY N/A 11/14/2016   Procedure: LAPAROSCOPIC CHOLECYSTECTOMY WITH INTRAOPERATIVE CHOLANGIOGRAM;   Surgeon: Robert Bellow, MD;  Location: [facility];  Service: General;  Laterality: N/A;  . COLONOSCOPY WITH PROPOFOL N/A 03/17/2019   Procedure: COLONOSCOPY WITH PROPOFOL;  Surgeon: Virgel Manifold, MD;  Location: [facility];  Service: Endoscopy;  Laterality: N/A;    Family History  Problem Relation Age of Onset  . Breast cancer Mother 46  . Breast cancer Sister 65  . Breast cancer Sister 28  . Breast cancer Sister 69  . Colon cancer Neg Hx     Social History   Socioeconomic History  . Marital status: Married    Spouse name: Not on file  . Number of children: Not on file  . Years of education: Not on file  . Highest education level: Not on file  Occupational History  . Not on file  Tobacco Use  . Smoking status: Never Smoker  . Smokeless tobacco: Never Used  Vaping Use  . Vaping Use: Never used  Substance and Sexual Activity  . Alcohol use: No  . Drug use: No  . Sexual activity: Not on file  Other Topics Concern  . Not on file  Social History Narrative  . Not on file   Social Determinants of Health   Financial Resource Strain:   . Difficulty of Paying Living Expenses: Not on file  Food Insecurity:   . Worried About Charity fundraiser in the Last Year: Not on file  . Ran Out of Food in the Last Year: Not on file  Transportation Needs:   . Film/video editor (Medical): Not on file  . Lack of Transportation (Non-Medical): Not on file  Physical Activity:   . Days of Exercise per Week: Not on file  . Minutes of Exercise per Session: Not on file  Stress:   . Feeling of Stress : Not on file  Social Connections:   . Frequency of Communication with Friends and Family: Not on file  . Frequency of Social Gatherings with Friends and Family: Not on file  . Attends Religious Services: Not on file  . Active Member of Clubs or Organizations: Not on file  . Attends Archivist Meetings: Not on file  . Marital Status: Not on file  Intimate Partner  Violence:   . Fear of Current or Ex-Partner: Not on file  . Emotionally Abused: Not on file  . Physically Abused: Not on file  . Sexually Abused: Not on file     Current Outpatient Medications:  .  acetaminophen (TYLENOL) 325 MG tablet, Take 650 mg by mouth every 6 (six) hours as needed for mild pain or headache. , Disp: , Rfl:  .  alendronate (FOSAMAX) 70 MG tablet, Take 1 tablet (70 mg total) by mouth every 7 (seven) days. Take with a full glass of water on an empty stomach., Disp: 4 tablet, Rfl: 11 .  aspirin EC 81 MG tablet, Take 81 mg by mouth daily., Disp: , Rfl:  .  atorvastatin (LIPITOR) 20 MG tablet, Take 20 mg by mouth at bedtime., Disp: , Rfl:  .  Cholecalciferol (VITAMIN D) 125 MCG (5000 UT) CAPS, 1  Capsule daily, Disp: 30 capsule, Rfl: 11 .  clopidogrel (PLAVIX) 75 MG tablet, Take 1 tablet (75 mg total) by mouth daily., Disp: 30 tablet, Rfl: 6 .  glucose blood test strip, DX E11.9  Check blood sugar daily, Disp: 100 each, Rfl: 12 .  levothyroxine (SYNTHROID, LEVOTHROID) 50 MCG tablet, Take 50 mcg by mouth daily before breakfast., Disp: , Rfl:  .  lisinopril (ZESTRIL) 20 MG tablet, Take 1 tablet by mouth once daily, Disp: 90 tablet, Rfl: 0 .  losartan-hydrochlorothiazide (HYZAAR) 100-12.5 MG tablet, Take 1 tablet by mouth daily., Disp: , Rfl:  .  metFORMIN (GLUCOPHAGE) 500 MG tablet, Take 1 tablet by mouth twice daily, Disp: 180 tablet, Rfl: 0   No Known Allergies  ROS Review of Systems  Constitutional: Positive for appetite change and fatigue.  HENT: Negative.   Eyes: Negative.        Recent cataract surgery bilaterally  Respiratory: Negative.   Cardiovascular: Negative.   Gastrointestinal: Negative.   Endocrine: Negative.   Genitourinary: Negative.   Musculoskeletal: Negative.   Skin: Negative.   Allergic/Immunologic: Negative.   Neurological: Negative.   Hematological: Negative.   Psychiatric/Behavioral: Negative.   All other systems reviewed and are  negative.    OBJECTIVE:    Physical Exam Vitals reviewed.  Constitutional:      Appearance: Normal appearance.  HENT:     Mouth/Throat:     Mouth: Mucous membranes are moist.  Eyes:     Pupils: Pupils are equal, round, and reactive to light.  Neck:     Vascular: Carotid bruit present.  Cardiovascular:     Rate and Rhythm: Normal rate and regular rhythm.     Pulses: Normal pulses.          Carotid pulses are 2+ on the left side with bruit.      Dorsalis pedis pulses are 2+ on  the right side and 2+ on the left side.     Heart sounds: Normal heart sounds.  Pulmonary:     Effort: Pulmonary effort is normal.     Breath sounds: Normal breath sounds.  Abdominal:     General: Bowel sounds are normal.     Palpations: Abdomen is soft. There is no hepatomegaly, splenomegaly or mass.     Tenderness: There is no abdominal tenderness.     Hernia: No hernia is present.  Musculoskeletal:        General: No tenderness.     Cervical back: Neck supple.     Right lower leg: No edema.     Left lower leg: No edema.  Feet:     Right foot:     Skin integrity: Callus present. No warmth (cold to touch).     Left foot:     Skin integrity: Callus present. No warmth (cold to touch).  Skin:    Findings: No rash.  Neurological:     Mental Status: She is alert and oriented to person, place, and time.     Motor: No weakness.  Psychiatric:        Mood and Affect: Mood and affect normal.        Behavior: Behavior normal.     There were no vitals taken for this visit. Wt Readings from Last 3 Encounters:  01/06/20 124 lb (56.2 kg)  12/16/19 126 lb (57.2 kg)  12/01/19 125 lb (56.7 kg)    Health Maintenance Due  Topic Date Due  . Hepatitis C Screening  Never done  . FOOT EXAM  Never done  . TETANUS/TDAP  Never done  . DEXA SCAN  Never done  . PNA vac Low Risk Adult (1 of 2 - PCV13) Never done  . MAMMOGRAM  08/16/2019  . INFLUENZA VACCINE  12/07/2019    There are no preventive care  reminders to display for this patient.  CBC Latest Ref Rng & Units 02/16/2020 10/31/2019  WBC 3.8 - 10.8 Thousand/uL 8.0 7.4  Hemoglobin 11.7 - 15.5 g/dL 12.7 10.8(L)  Hematocrit 35 - 45 % 36.5 30.5(L)  Platelets 140 - 400 Thousand/uL 278 208   CMP Latest Ref Rng & Units 02/16/2020 10/31/2019 10/30/2019  Glucose 70 - 99 mg/dL - 130(H) -  BUN 8 - 23 mg/dL - 13 16  Creatinine 0.44 - 1.00 mg/dL - 0.67 0.71  Sodium 135 - 145 mmol/L - 130(L) -  Potassium 3.5 - 5.1 mmol/L - 4.3 -  Chloride 98 - 111 mmol/L - 101 -  CO2 22 - 32 mmol/L - 24 -  Calcium 8.9 - 10.3 mg/dL - 8.1(L) -  Total Protein 6.1 - 8.1 g/dL 7.3 - -  Total Bilirubin 0.2 - 1.2 mg/dL 0.4 - -  AST 10 - 35 U/L 21 - -  ALT 6 - 29 U/L 18 - -    No results found for: TSH Lab Results  Component Value Date   ANIONGAP 5 10/31/2019   No results found for: CHOL, HDL, LDLCALC, CHOLHDL No results found for: TRIG Lab Results  Component Value Date   HGBA1C 6.5 (H) 02/16/2020      ASSESSMENT & PLAN:    Follow-up: No follow-ups on file.    Cletis Athens, MD [facility] [address], [place], [place] [address]   By signing my name below, I, [employer], attest that this documentation has been prepared under the direction and in the presence of  Dr. Cletis Athens Electronically Signed: Cletis Athens, MD 02/22/20, 4:04 PM  I personally performed the services described in this documentation, which was SCRIBED in my presence. The recorded information has been reviewed and considered accurate. It has been edited as necessary during review. Cletis Athens, MD

## 2020-02-17 ENCOUNTER — Other Ambulatory Visit: Payer: Self-pay

## 2020-02-17 LAB — HEMOGLOBIN A1C
Hgb A1c MFr Bld: 6.5 % of total Hgb — ABNORMAL HIGH (ref <5.7)
Mean Plasma Glucose: 140 (calc)
eAG (mmol/L): 7.7 (calc)

## 2020-02-17 LAB — CBC WITH DIFFERENTIAL/PLATELET
Absolute Monocytes: 448 cells/uL (ref 200–950)
Basophils Absolute: 48 cells/uL (ref 0–200)
Basophils Relative: 0.6 %
Eosinophils Absolute: 160 cells/uL (ref 15–500)
Eosinophils Relative: 2 %
HCT: 36.5 % (ref 35.0–45.0)
Hemoglobin: 12.7 g/dL (ref 11.7–15.5)
Lymphs Abs: 1448 cells/uL (ref 850–3900)
MCH: 29.3 pg (ref 27.0–33.0)
MCHC: 34.8 g/dL (ref 32.0–36.0)
MCV: 84.1 fL (ref 80.0–100.0)
MPV: 9.4 fL (ref 7.5–12.5)
Monocytes Relative: 5.6 %
Neutro Abs: 5896 cells/uL (ref 1500–7800)
Neutrophils Relative %: 73.7 %
Platelets: 278 10*3/uL (ref 140–400)
RBC: 4.34 10*6/uL (ref 3.80–5.10)
RDW: 12 % (ref 11.0–15.0)
Total Lymphocyte: 18.1 %
WBC: 8 10*3/uL (ref 3.8–10.8)

## 2020-02-17 LAB — HEPATIC FUNCTION PANEL
AG Ratio: 1.6 (calc) (ref 1.0–2.5)
ALT: 18 U/L (ref 6–29)
AST: 21 U/L (ref 10–35)
Albumin: 4.5 g/dL (ref 3.6–5.1)
Alkaline phosphatase (APISO): 59 U/L (ref 37–153)
Bilirubin, Direct: 0.1 mg/dL (ref 0.0–0.2)
Globulin: 2.8 g/dL (calc) (ref 1.9–3.7)
Indirect Bilirubin: 0.3 mg/dL (calc) (ref 0.2–1.2)
Total Bilirubin: 0.4 mg/dL (ref 0.2–1.2)
Total Protein: 7.3 g/dL (ref 6.1–8.1)

## 2020-02-17 MED ORDER — GLUCOSE BLOOD VI STRP: ORAL_STRIP | 12 refills | Status: DC

## 2020-02-22 DIAGNOSIS — Z23 Encounter for immunization: Secondary | ICD-10-CM | POA: Insufficient documentation

## 2020-02-22 NOTE — Assessment & Plan Note (Signed)
Patient will be referred to GI specialist for evaluation of loss of weight.  She will need an endoscopy and colonoscopy and possible imaging study for the abdomen.

## 2020-02-22 NOTE — Assessment & Plan Note (Signed)
Patient was advised to take boost her Covid shot.  As well as a flu shot.

## 2020-02-22 NOTE — Assessment & Plan Note (Addendum)
Patient is taking statin and Plavix for carotid stenosis.  She has a stent on the left carotid  she has residual 60% carotid stenosis on the right side.  She does not have any abdominal bruit better pulses are okay.  Patient is being followed up by Dr. Lucky Cowboy for her peripheral vascular disease.

## 2020-02-22 NOTE — Assessment & Plan Note (Signed)
Patient seems to have lost weight she is anemic.  She needs colonoscopy as well as endoscopy.  She is taking medicine for osteoporosis.  She was also advised to increase activity and walk on a daily basis.  She is advised to follow low-cholesterol diet and continue taking her present medication as directed.  She was also advised to have a regular follow-up with the vascular specialist.

## 2020-02-22 NOTE — Assessment & Plan Note (Signed)
Needs follow-up for that.

## 2020-02-22 NOTE — Assessment & Plan Note (Signed)
-   Today, the patient's blood pressure is well managed on lisinopril. - The patient will continue the current treatment regimen.  - I encouraged the patient to eat a low-sodium diet to help control blood pressure. - I encouraged the patient to live an active lifestyle and complete activities that increases heart rate to 85% target heart rate at least 5 times per week for one hour.     

## 2020-02-22 NOTE — Assessment & Plan Note (Signed)
Refer to GI 

## 2020-03-01 ENCOUNTER — Other Ambulatory Visit (INDEPENDENT_AMBULATORY_CARE_PROVIDER_SITE_OTHER): Payer: Self-pay | Admitting: Nurse Practitioner

## 2020-03-01 DIAGNOSIS — I6523 Occlusion and stenosis of bilateral carotid arteries: Secondary | ICD-10-CM

## 2020-03-01 DIAGNOSIS — Z9889 Other specified postprocedural states: Secondary | ICD-10-CM

## 2020-03-01 DIAGNOSIS — Z95828 Presence of other vascular implants and grafts: Secondary | ICD-10-CM

## 2020-03-02 ENCOUNTER — Ambulatory Visit (INDEPENDENT_AMBULATORY_CARE_PROVIDER_SITE_OTHER): Payer: Medicare Other

## 2020-03-02 ENCOUNTER — Encounter (INDEPENDENT_AMBULATORY_CARE_PROVIDER_SITE_OTHER): Payer: Self-pay | Admitting: Vascular Surgery

## 2020-03-02 ENCOUNTER — Ambulatory Visit (INDEPENDENT_AMBULATORY_CARE_PROVIDER_SITE_OTHER): Payer: Medicare Other | Admitting: Vascular Surgery

## 2020-03-02 ENCOUNTER — Other Ambulatory Visit: Payer: Self-pay

## 2020-03-02 VITALS — BP 136/64 | HR 56 | Ht 60.0 in | Wt 120.0 lb

## 2020-03-02 DIAGNOSIS — Z95828 Presence of other vascular implants and grafts: Secondary | ICD-10-CM

## 2020-03-02 DIAGNOSIS — I10 Essential (primary) hypertension: Secondary | ICD-10-CM

## 2020-03-02 DIAGNOSIS — Z9889 Other specified postprocedural states: Secondary | ICD-10-CM | POA: Diagnosis not present

## 2020-03-02 DIAGNOSIS — I6523 Occlusion and stenosis of bilateral carotid arteries: Secondary | ICD-10-CM | POA: Diagnosis not present

## 2020-03-02 DIAGNOSIS — E119 Type 2 diabetes mellitus without complications: Secondary | ICD-10-CM | POA: Diagnosis not present

## 2020-03-02 NOTE — Assessment & Plan Note (Signed)
blood glucose control important in reducing the progression of atherosclerotic disease. Also, involved in wound healing. On appropriate medications.  

## 2020-03-02 NOTE — Assessment & Plan Note (Signed)
blood pressure control important in reducing the progression of atherosclerotic disease. On appropriate oral medications.  

## 2020-03-02 NOTE — Progress Notes (Signed)
Patient ID: Sheena Parker, female   DOB: 1950-10-28, 69 y.o.   MRN: [id number]  Chief Complaint  Patient presents with  . Follow-up    14mo U/S follow up    HPI Sheena Parker is a 69 y.o. female.  Patient returns in follow-up of her carotid stenosis.  She is about 4 months status post left carotid stent placement for high-grade stenosis.  She is doing well.  She has some degree of rash that may be attributed to her Plavix.  That was not present before the procedure.  No new focal neurologic symptoms.  Carotid duplex today reveals stable 60 to 79% right ICA stenosis with a widely patent left carotid stent.   Past Medical History:  Diagnosis Date  . Arthritis    KNEE, hands  . Diabetes mellitus without complication ([place])   . Difficult intubation   . GERD (gastroesophageal reflux disease)    NO MEDS  . Hyperlipidemia   . Hypertension   . Hypothyroidism   . Presence of dental prosthetic device    Dental implants,  top - front 2 teeth  . Thyroid disease     Past Surgical History:  Procedure Laterality Date  . CAROTID PTA/STENT INTERVENTION Left 10/30/2019   Procedure: CAROTID PTA/STENT INTERVENTION;  Surgeon: Algernon Huxley, MD;  Location: [facility];  Service: Cardiovascular;  Laterality: Left;  . CATARACT EXTRACTION W/PHACO Right 12/16/2019   Procedure: CATARACT EXTRACTION PHACO AND INTRAOCULAR LENS PLACEMENT (IOC) RIGHT DIABETIC 7.57 00:55.0;  Surgeon: Birder Robson, MD;  Location: [facility];  Service: Ophthalmology;  Laterality: Right;  Diabetic - oral meds  . CATARACT EXTRACTION W/PHACO Left 01/06/2020   Procedure: CATARACT EXTRACTION PHACO AND INTRAOCULAR LENS PLACEMENT (IOC) LEFT DIABETIC 7.12  00:44.5;  Surgeon: Birder Robson, MD;  Location: [facility];  Service: Ophthalmology;  Laterality: Left;  . CHOLECYSTECTOMY N/A 11/14/2016   Procedure: LAPAROSCOPIC CHOLECYSTECTOMY WITH INTRAOPERATIVE CHOLANGIOGRAM;  Surgeon: Robert Bellow, MD;   Location: [facility];  Service: General;  Laterality: N/A;  . COLONOSCOPY WITH PROPOFOL N/A 03/17/2019   Procedure: COLONOSCOPY WITH PROPOFOL;  Surgeon: Virgel Manifold, MD;  Location: [facility];  Service: Endoscopy;  Laterality: N/A;      No Known Allergies  Current Outpatient Medications  Medication Sig Dispense Refill  . acetaminophen (TYLENOL) 325 MG tablet Take 650 mg by mouth every 6 (six) hours as needed for mild pain or headache.     . alendronate (FOSAMAX) 70 MG tablet Take 1 tablet (70 mg total) by mouth every 7 (seven) days. Take with a full glass of water on an empty stomach. 4 tablet 11  . aspirin EC 81 MG tablet Take 81 mg by mouth daily.    Marland Kitchen atorvastatin (LIPITOR) 20 MG tablet Take 20 mg by mouth at bedtime.    . Cholecalciferol (VITAMIN D) 125 MCG (5000 UT) CAPS 1  Capsule daily 30 capsule 11  . clopidogrel (PLAVIX) 75 MG tablet Take 1 tablet (75 mg total) by mouth daily. 30 tablet 6  . glucose blood test strip DX E11.9  Check blood sugar daily 100 each 12  . levothyroxine (SYNTHROID, LEVOTHROID) 50 MCG tablet Take 50 mcg by mouth daily before breakfast.    . lisinopril (ZESTRIL) 20 MG tablet Take 1 tablet by mouth once daily 90 tablet 0  . losartan-hydrochlorothiazide (HYZAAR) 100-12.5 MG tablet Take 1 tablet by mouth daily.    . metFORMIN (GLUCOPHAGE) 500 MG tablet Take 1 tablet by mouth twice  daily 180 tablet 0   No current facility-administered medications for this visit.   Social History       Tobacco Use  . Smoking status: Never Smoker  . Smokeless tobacco: Never Used  Vaping Use  . Vaping Use: Never used  Substance Use Topics  . Alcohol use: No  . Drug use: No          Family History  Problem Relation Age of Onset  . Breast cancer Mother 50  . Breast cancer Sister 45  . Breast cancer Sister 32  . Breast cancer Sister 31  . Colon cancer Neg Hx     No Known Allergies   REVIEW OF SYSTEMS (Negative unless  checked)  Constitutional: [] ?Weight loss  [] ?Fever  [] ?Chills Cardiac: [] ?Chest pain   [] ?Chest pressure   [] ?Palpitations   [] ?Shortness of breath when laying flat   [] ?Shortness of breath at rest   [] ?Shortness of breath with exertion. Vascular:  [] ?Pain in legs with walking   [] ?Pain in legs at rest   [] ?Pain in legs when laying flat   [] ?Claudication   [] ?Pain in feet when walking  [] ?Pain in feet at rest  [] ?Pain in feet when laying flat   [] ?History of DVT   [] ?Phlebitis   [] ?Swelling in legs   [] ?Varicose veins   [] ?Non-healing ulcers Pulmonary:   [] ?Uses home oxygen   [] ?Productive cough   [] ?Hemoptysis   [] ?Wheeze  [] ?COPD   [] ?Asthma Neurologic:  [] ?Dizziness  [] ?Blackouts   [] ?Seizures   [] ?History of stroke   [] ?History of TIA  [] ?Aphasia   [] ?Temporary blindness   [] ?Dysphagia   [] ?Weakness or numbness in arms   [] ?Weakness or numbness in legs Musculoskeletal:  [x] ?Arthritis   [] ?Joint swelling   [x] ?Joint pain   [] ?Low back pain Hematologic:  [] ?Easy bruising  [] ?Easy bleeding   [] ?Hypercoagulable state   [] ?Anemic   Gastrointestinal:  [] ?Blood in stool   [] ?Vomiting blood  [] ?Gastroesophageal reflux/heartburn   [] ?Abdominal pain Genitourinary:  [] ?Chronic kidney disease   [] ?Difficult urination  [] ?Frequent urination  [] ?Burning with urination   [] ?Hematuria Skin:  [] ?Rashes   [] ?Ulcers   [] ?Wounds Psychological:  [] ?History of anxiety   [] ? History of major depression.       Physical Exam BP 136/64   Pulse (!) 56   Ht 5' (1.524 m)   Wt 120 lb (54.4 kg)   BMI 23.44 kg/m  Gen:  WD/WN, NAD Skin: incision C/D/I     Assessment/Plan:  Carotid stenosis Duplex today shows the left carotid stent to be widely patent.  Right carotid stenosis stable in the 60 to 79% range.  Continue Plavix for a total of 3 months then she is going to stop it as she does appear to be having some degree of rash that is likely related to Plavix.  I will plan to follow her in 3 months with duplex  or sooner if problems develop in the interim.  Continue aspirin and statin agent ongoing.  Essential hypertension blood pressure control important in reducing the progression of atherosclerotic disease. On appropriate oral medications.   Diabetes ([place]) blood glucose control important in reducing the progression of atherosclerotic disease. Also, involved in wound healing. On appropriate medications.       Leotis Pain 03/02/2020, 3:53 PM   This note was created with Dragon medical transcription system.  Any errors from dictation are unintentional.

## 2020-03-02 NOTE — Assessment & Plan Note (Signed)
Duplex today shows the left carotid stent to be widely patent.  Right carotid stenosis stable in the 60 to 79% range.  Continue Plavix for a total of 3 months then she is going to stop it as she does appear to be having some degree of rash that is likely related to Plavix.  I will plan to follow her in 3 months with duplex or sooner if problems develop in the interim.  Continue aspirin and statin agent ongoing.

## 2020-04-19 ENCOUNTER — Other Ambulatory Visit: Payer: Self-pay | Admitting: *Deleted

## 2020-04-19 DIAGNOSIS — E119 Type 2 diabetes mellitus without complications: Secondary | ICD-10-CM

## 2020-04-22 ENCOUNTER — Other Ambulatory Visit: Payer: Self-pay

## 2020-04-22 ENCOUNTER — Encounter: Payer: Self-pay | Admitting: Family Medicine

## 2020-04-22 ENCOUNTER — Ambulatory Visit (INDEPENDENT_AMBULATORY_CARE_PROVIDER_SITE_OTHER): Payer: Medicare Other | Admitting: Family Medicine

## 2020-04-22 VITALS — BP 139/68 | HR 65 | Ht 60.0 in | Wt 126.8 lb

## 2020-04-22 DIAGNOSIS — E119 Type 2 diabetes mellitus without complications: Secondary | ICD-10-CM

## 2020-04-22 LAB — GLUCOSE, POCT (MANUAL RESULT ENTRY): POC Glucose: 199 mg/dl — AB (ref 70–99)

## 2020-04-22 NOTE — Progress Notes (Signed)
Established Patient Office Visit  SUBJECTIVE:  Subjective  Patient ID: Sheena Parker, female    DOB: June 13, 1950  Age: 69 y.o. MRN: [id number]  CC:  Chief Complaint  Patient presents with  . Diabetes    Patient is here for a diabetes check up and a new rx for diabetic shoes    HPI Sheena Parker is a 69 y.o. female presenting today for     Past Medical History:  Diagnosis Date  . Arthritis    KNEE, hands  . Diabetes mellitus without complication ([place])   . Difficult intubation   . GERD (gastroesophageal reflux disease)    NO MEDS  . Hyperlipidemia   . Hypertension   . Hypothyroidism   . Presence of dental prosthetic device    Dental implants,  top - front 2 teeth  . Thyroid disease     Past Surgical History:  Procedure Laterality Date  . CAROTID PTA/STENT INTERVENTION Left 10/30/2019   Procedure: CAROTID PTA/STENT INTERVENTION;  Surgeon: Algernon Huxley, MD;  Location: [facility];  Service: Cardiovascular;  Laterality: Left;  . CATARACT EXTRACTION W/PHACO Right 12/16/2019   Procedure: CATARACT EXTRACTION PHACO AND INTRAOCULAR LENS PLACEMENT (IOC) RIGHT DIABETIC 7.57 00:55.0;  Surgeon: Birder Robson, MD;  Location: [facility];  Service: Ophthalmology;  Laterality: Right;  Diabetic - oral meds  . CATARACT EXTRACTION W/PHACO Left 01/06/2020   Procedure: CATARACT EXTRACTION PHACO AND INTRAOCULAR LENS PLACEMENT (IOC) LEFT DIABETIC 7.12  00:44.5;  Surgeon: Birder Robson, MD;  Location: [facility];  Service: Ophthalmology;  Laterality: Left;  . CHOLECYSTECTOMY N/A 11/14/2016   Procedure: LAPAROSCOPIC CHOLECYSTECTOMY WITH INTRAOPERATIVE CHOLANGIOGRAM;  Surgeon: Robert Bellow, MD;  Location: [facility];  Service: General;  Laterality: N/A;  . COLONOSCOPY WITH PROPOFOL N/A 03/17/2019   Procedure: COLONOSCOPY WITH PROPOFOL;  Surgeon: Virgel Manifold, MD;  Location: [facility];  Service: Endoscopy;  Laterality: N/A;    Family History  Problem  Relation Age of Onset  . Breast cancer Mother 53  . Breast cancer Sister 49  . Breast cancer Sister 78  . Breast cancer Sister 33  . Colon cancer Neg Hx     Social History   Socioeconomic History  . Marital status: Married    Spouse name: Not on file  . Number of children: Not on file  . Years of education: Not on file  . Highest education level: Not on file  Occupational History  . Not on file  Tobacco Use  . Smoking status: Never Smoker  . Smokeless tobacco: Never Used  Vaping Use  . Vaping Use: Never used  Substance and Sexual Activity  . Alcohol use: No  . Drug use: No  . Sexual activity: Not on file  Other Topics Concern  . Not on file  Social History Narrative  . Not on file   Social Determinants of Health   Financial Resource Strain: Not on file  Food Insecurity: Not on file  Transportation Needs: Not on file  Physical Activity: Not on file  Stress: Not on file  Social Connections: Not on file  Intimate Partner Violence: Not on file     Current Outpatient Medications:  .  acetaminophen (TYLENOL) 325 MG tablet, Take 650 mg by mouth every 6 (six) hours as needed for mild pain or headache. , Disp: , Rfl:  .  alendronate (FOSAMAX) 70 MG tablet, Take 1 tablet (70 mg total) by mouth every 7 (seven) days. Take with a full glass of  water on an empty stomach., Disp: 4 tablet, Rfl: 11 .  aspirin EC 81 MG tablet, Take 81 mg by mouth daily., Disp: , Rfl:  .  atorvastatin (LIPITOR) 20 MG tablet, Take 20 mg by mouth at bedtime., Disp: , Rfl:  .  Cholecalciferol (VITAMIN D) 125 MCG (5000 UT) CAPS, 1  Capsule daily, Disp: 30 capsule, Rfl: 11 .  clopidogrel (PLAVIX) 75 MG tablet, Take 1 tablet (75 mg total) by mouth daily., Disp: 30 tablet, Rfl: 6 .  glucose blood test strip, DX E11.9  Check blood sugar daily, Disp: 100 each, Rfl: 12 .  levothyroxine (SYNTHROID, LEVOTHROID) 50 MCG tablet, Take 50 mcg by mouth daily before breakfast., Disp: , Rfl:  .  lisinopril (ZESTRIL) 20  MG tablet, Take 1 tablet by mouth once daily, Disp: 90 tablet, Rfl: 0 .  losartan-hydrochlorothiazide (HYZAAR) 100-12.5 MG tablet, Take 1 tablet by mouth daily., Disp: , Rfl:  .  metFORMIN (GLUCOPHAGE) 500 MG tablet, Take 1 tablet by mouth twice daily, Disp: 180 tablet, Rfl: 0   No Known Allergies  ROS Review of Systems  Constitutional: Negative.   HENT: Negative.   Cardiovascular: Negative.   Psychiatric/Behavioral: Negative.      OBJECTIVE:    Physical Exam Cardiovascular:     Rate and Rhythm: Normal rate and regular rhythm.     Heart sounds: Murmur heard.    Skin:    General: Skin is warm.  Neurological:     General: No focal deficit present.  Psychiatric:        Mood and Affect: Mood normal.     BP 139/68   Pulse 65   Ht 5' (1.524 m)   Wt 126 lb 12.8 oz (57.5 kg)   BMI 24.76 kg/m  Wt Readings from Last 3 Encounters:  04/22/20 126 lb 12.8 oz (57.5 kg)  03/02/20 120 lb (54.4 kg)  01/06/20 124 lb (56.2 kg)    Health Maintenance Due  Topic Date Due  . Hepatitis C Screening  Never done  . FOOT EXAM  Never done  . TETANUS/TDAP  Never done  . DEXA SCAN  Never done  . PNA vac Low Risk Adult (1 of 2 - PCV13) Never done  . MAMMOGRAM  08/16/2019  . INFLUENZA VACCINE  12/07/2019    There are no preventive care reminders to display for this patient.  CBC Latest Ref Rng & Units 02/16/2020 10/31/2019  WBC 3.8 - 10.8 Thousand/uL 8.0 7.4  Hemoglobin 11.7 - 15.5 g/dL 12.7 10.8(L)  Hematocrit 35.0 - 45.0 % 36.5 30.5(L)  Platelets 140 - 400 Thousand/uL 278 208   CMP Latest Ref Rng & Units 02/16/2020 10/31/2019 10/30/2019  Glucose 70 - 99 mg/dL - 130(H) -  BUN 8 - 23 mg/dL - 13 16  Creatinine 0.44 - 1.00 mg/dL - 0.67 0.71  Sodium 135 - 145 mmol/L - 130(L) -  Potassium 3.5 - 5.1 mmol/L - 4.3 -  Chloride 98 - 111 mmol/L - 101 -  CO2 22 - 32 mmol/L - 24 -  Calcium 8.9 - 10.3 mg/dL - 8.1(L) -  Total Protein 6.1 - 8.1 g/dL 7.3 - -  Total Bilirubin 0.2 - 1.2 mg/dL 0.4 -  -  AST 10 - 35 U/L 21 - -  ALT 6 - 29 U/L 18 - -    No results found for: TSH Lab Results  Component Value Date   ANIONGAP 5 10/31/2019   No results found for: CHOL, HDL, LDLCALC, CHOLHDL No results  found for: TRIG Lab Results  Component Value Date   HGBA1C 6.5 (H) 02/16/2020      ASSESSMENT & PLAN:   Problem List Items Addressed This Visit      Endocrine   Diabetes ([place]) - Primary    Diabetes mellitus Type II, under good control.. Discussed general issues about diabetes pathophysiology and management. Addressed ADA diet. Discussed foot care.   Diabetic shoes ordered today.        Relevant Orders   POCT glucose (manual entry) (Completed)      No orders of the defined types were placed in this encounter.     Follow-up: No follow-ups on file.    Beckie Salts, [place] [address], [place], [place] [address]

## 2020-04-22 NOTE — Assessment & Plan Note (Signed)
Diabetes mellitus Type II, under good control.. Discussed general issues about diabetes pathophysiology and management. Addressed ADA diet. Discussed foot care.   Diabetic shoes ordered today.

## 2020-04-23 ENCOUNTER — Encounter: Payer: Self-pay | Admitting: Gastroenterology

## 2020-04-23 ENCOUNTER — Ambulatory Visit (INDEPENDENT_AMBULATORY_CARE_PROVIDER_SITE_OTHER): Payer: Medicare Other | Admitting: Gastroenterology

## 2020-04-23 VITALS — BP 136/72 | HR 62 | Temp 98.0°F | Ht 60.0 in | Wt 125.5 lb

## 2020-04-23 DIAGNOSIS — R634 Abnormal weight loss: Secondary | ICD-10-CM | POA: Diagnosis not present

## 2020-04-23 NOTE — Progress Notes (Signed)
Cephas Darby, MD [address]  [place]  [place], [place] [address]  Main: [phone]  Fax: [phone]    Gastroenterology Consultation  Referring Provider:     Cletis Athens, MD Primary Care Physician:  Cletis Athens, MD Primary Gastroenterologist:  Dr. Cephas Darby Reason for Consultation:     Weight loss        HPI:   Sheena Parker is a 69 y.o. female referred by Dr. Cletis Athens, MD  for consultation & management of weight loss Patient has history of diabetes, on Metformin, history of cholecystectomy secondary to cholecystitis and cholelithiasis in 2018, history of carotid artery stenosis s/p left carotid stent placement on 10/30/2019 secondary to high-grade stenosis, has been on Plavix. Patient is referred to me due to weight loss. She weighed 129 pounds before the stent placement on 10/21/2019, her weight decreased gradually to 120 pounds until 03/02/2020. Patient reports that since then, her appetite has gradually improved and today she weighs 126 pounds. She denies any GI symptoms with me today. Patient is accompanied by her husband. She reports that she had a lot of GI symptoms before her gallbladder was removed. She is very active at home. Patient was anemic based on labs on 10/31/2019, hemoglobin 10.8, her most recent labs from October revealed normal hemoglobin 12.7, MCV normal, platelets normal, LFTs were normal. She underwent colonoscopy in 2020 which revealed 4 mm tubular adenoma of the colon   NSAIDs: None  Antiplts/Anticoagulants/Anti thrombotics: None  GI Procedures: Colonoscopy 03/17/2019 - One 4 mm polyp in the ascending colon, removed with a cold biopsy forceps. Resected and retrieved. - The examination was otherwise normal. - The rectum, sigmoid colon, descending colon, transverse colon, ascending colon and cecum are normal. - The distal rectum and anal verge are normal on retroflexion view. DIAGNOSIS:  A. COLON POLYP, ASCENDING; COLD BIOPSY:  -  TUBULAR ADENOMA.  - NEGATIVE FOR HIGH GRADE DYSPLASIA AND MALIGNANCY.  Past Medical History:  Diagnosis Date   Arthritis    KNEE, hands   Diabetes mellitus without complication ([place])    Difficult intubation    GERD (gastroesophageal reflux disease)    NO MEDS   Hyperlipidemia    Hypertension    Hypothyroidism    Presence of dental prosthetic device    Dental implants,  top - front 2 teeth   Thyroid disease     Past Surgical History:  Procedure Laterality Date   CAROTID PTA/STENT INTERVENTION Left 10/30/2019   Procedure: CAROTID PTA/STENT INTERVENTION;  Surgeon: Algernon Huxley, MD;  Location: [facility];  Service: Cardiovascular;  Laterality: Left;   CATARACT EXTRACTION W/PHACO Right 12/16/2019   Procedure: CATARACT EXTRACTION PHACO AND INTRAOCULAR LENS PLACEMENT (IOC) RIGHT DIABETIC 7.57 00:55.0;  Surgeon: Birder Robson, MD;  Location: [facility];  Service: Ophthalmology;  Laterality: Right;  Diabetic - oral meds   CATARACT EXTRACTION W/PHACO Left 01/06/2020   Procedure: CATARACT EXTRACTION PHACO AND INTRAOCULAR LENS PLACEMENT (IOC) LEFT DIABETIC 7.12  00:44.5;  Surgeon: Birder Robson, MD;  Location: [facility];  Service: Ophthalmology;  Laterality: Left;   CHOLECYSTECTOMY N/A 11/14/2016   Procedure: LAPAROSCOPIC CHOLECYSTECTOMY WITH INTRAOPERATIVE CHOLANGIOGRAM;  Surgeon: Robert Bellow, MD;  Location: [facility];  Service: General;  Laterality: N/A;   COLONOSCOPY WITH PROPOFOL N/A 03/17/2019   Procedure: COLONOSCOPY WITH PROPOFOL;  Surgeon: Virgel Manifold, MD;  Location: [facility];  Service: Endoscopy;  Laterality: N/A;    Current Outpatient Medications:    acetaminophen (TYLENOL)  325 MG tablet, Take 650 mg by mouth every 6 (six) hours as needed for mild pain or headache. , Disp: , Rfl:    alendronate (FOSAMAX) 70 MG tablet, Take 1 tablet (70 mg total) by mouth every 7 (seven) days. Take with a full glass of water on an  empty stomach., Disp: 4 tablet, Rfl: 11   aspirin EC 81 MG tablet, Take 81 mg by mouth daily., Disp: , Rfl:    atorvastatin (LIPITOR) 20 MG tablet, Take 20 mg by mouth at bedtime., Disp: , Rfl:    Cholecalciferol (VITAMIN D) 125 MCG (5000 UT) CAPS, 1  Capsule daily, Disp: 30 capsule, Rfl: 11   clopidogrel (PLAVIX) 75 MG tablet, Take 1 tablet (75 mg total) by mouth daily., Disp: 30 tablet, Rfl: 6   glucose blood test strip, DX E11.9  Check blood sugar daily, Disp: 100 each, Rfl: 12   levothyroxine (SYNTHROID, LEVOTHROID) 50 MCG tablet, Take 50 mcg by mouth daily before breakfast., Disp: , Rfl:    lisinopril (ZESTRIL) 20 MG tablet, Take 1 tablet by mouth once daily, Disp: 90 tablet, Rfl: 0   losartan-hydrochlorothiazide (HYZAAR) 100-12.5 MG tablet, Take 1 tablet by mouth daily., Disp: , Rfl:    metFORMIN (GLUCOPHAGE) 500 MG tablet, Take 1 tablet by mouth twice daily, Disp: 180 tablet, Rfl: 0   Family History  Problem Relation Age of Onset   Breast cancer Mother 52   Breast cancer Sister 4   Breast cancer Sister 94   Breast cancer Sister 65   Colon cancer Neg Hx      Social History   Tobacco Use   Smoking status: Never Smoker   Smokeless tobacco: Never Used  Vaping Use   Vaping Use: Never used  Substance Use Topics   Alcohol use: No   Drug use: No    Allergies as of 04/23/2020   (No Known Allergies)    Review of Systems:    All systems reviewed and negative except where noted in HPI.   Physical Exam:  BP 136/72 (BP Location: Left Arm, Patient Position: Sitting, Cuff Size: Normal)    Pulse 62    Temp 98 F (36.7 C) (Oral)    Ht 5' (1.524 m)    Wt 125 lb 8 oz (56.9 kg)    BMI 24.51 kg/m  No LMP recorded. Patient is postmenopausal.  General:   Alert,  Well-developed, well-nourished, pleasant and cooperative in NAD Head:  Normocephalic and atraumatic. Eyes:  Sclera clear, no icterus.   Conjunctiva pink. Ears:  Normal auditory acuity. Nose:  No  deformity, discharge, or lesions. Mouth:  No deformity or lesions,oropharynx pink & moist. Neck:  Supple; no masses or thyromegaly. Lungs:  Respirations even and unlabored.  Clear throughout to auscultation.   No wheezes, crackles, or rhonchi. No acute distress. Heart:  Regular rate and rhythm; no murmurs, clicks, rubs, or gallops. Abdomen:  Normal bowel sounds. Soft, non-tender and non-distended without masses, hepatosplenomegaly or hernias noted.  No guarding or rebound tenderness.   Rectal: Not performed Msk:  Symmetrical without gross deformities. Good, equal movement & strength bilaterally. Pulses:  Normal pulses noted. Extremities:  No clubbing or edema.  No cyanosis. Neurologic:  Alert and oriented x3;  grossly normal neurologically. Skin:  Intact without significant lesions or rashes. No jaundice. Lymph Nodes:  No significant cervical adenopathy. Psych:  Alert and cooperative. Normal mood and affect.  Imaging Studies: No recent abdominal imaging  Assessment and Plan:   Sheena Parker is  a 69 y.o. female with history of diabetes, left carotid artery stenosis s/p stent placement in 6/21, on Plavix, s/p cholecystectomy in 2018 secondary to symptomatic cholelithiasis and cholecystitis is seen in consultation for transient weight loss between June and October 2021. Currently, patient has regained most of her weight with her appetite back to baseline. She does not have any GI signs or symptoms that warrants further GI evaluation at this time. Patient and her husband felt reassured, answered all their questions She will need surveillance colonoscopy in 5 years   Follow up as needed   Cephas Darby, MD

## 2020-04-25 ENCOUNTER — Other Ambulatory Visit: Payer: Self-pay | Admitting: Internal Medicine

## 2020-04-29 ENCOUNTER — Other Ambulatory Visit: Payer: Self-pay

## 2020-04-29 ENCOUNTER — Ambulatory Visit (INDEPENDENT_AMBULATORY_CARE_PROVIDER_SITE_OTHER): Payer: Medicare Other | Admitting: Podiatry

## 2020-04-29 ENCOUNTER — Encounter: Payer: Self-pay | Admitting: Podiatry

## 2020-04-29 DIAGNOSIS — E119 Type 2 diabetes mellitus without complications: Secondary | ICD-10-CM

## 2020-04-29 DIAGNOSIS — M2041 Other hammer toe(s) (acquired), right foot: Secondary | ICD-10-CM | POA: Diagnosis not present

## 2020-04-29 DIAGNOSIS — M2042 Other hammer toe(s) (acquired), left foot: Secondary | ICD-10-CM

## 2020-04-29 MED ORDER — GABAPENTIN 100 MG PO CAPS: 100.0000 mg | ORAL_CAPSULE | Freq: Three times a day (TID) | ORAL | 3 refills | Status: DC

## 2020-05-03 ENCOUNTER — Encounter: Payer: Self-pay | Admitting: Podiatry

## 2020-05-03 NOTE — Progress Notes (Signed)
Subjective: Sheena Parker presents today referred by Cletis Athens, MD for diabetic foot evaluation.  Patient relates 1 year year history of diabetes.  Patient denies any history of foot wounds.  Patient mentions that she has history of numbness, tingling, burning, pins/needles sensations.  Past Medical History:  Diagnosis Date  . Anemia 02/16/2020  . Arthritis    KNEE, hands  . Diabetes mellitus without complication ([place])   . Difficult intubation   . Gall stones 10/31/2016  . GERD (gastroesophageal reflux disease)    NO MEDS  . Hyperlipidemia   . Hypertension   . Hypothyroidism   . Presence of dental prosthetic device    Dental implants,  top - front 2 teeth  . Thyroid disease   . Weight decrease 02/16/2020  . Weight loss 02/16/2020    Patient Active Problem List   Diagnosis Date Noted  . Need for COVID-19 vaccine 02/22/2020  . Carotid stenosis 09/26/2019  . Essential hypertension 07/15/2019  . Diabetes ([place]) 07/15/2019  . Left carotid bruit 07/15/2019    Past Surgical History:  Procedure Laterality Date  . CAROTID PTA/STENT INTERVENTION Left 10/30/2019   Procedure: CAROTID PTA/STENT INTERVENTION;  Surgeon: Algernon Huxley, MD;  Location: [place] [facility];  Service: Cardiovascular;  Laterality: Left;  . CATARACT EXTRACTION W/PHACO Right 12/16/2019   Procedure: CATARACT EXTRACTION PHACO AND INTRAOCULAR LENS PLACEMENT (IOC) RIGHT DIABETIC 7.57 00:55.0;  Surgeon: Birder Robson, MD;  Location: [place];  Service: Ophthalmology;  Laterality: Right;  Diabetic - oral meds  . CATARACT EXTRACTION W/PHACO Left 01/06/2020   Procedure: CATARACT EXTRACTION PHACO AND INTRAOCULAR LENS PLACEMENT (IOC) LEFT DIABETIC 7.12  00:44.5;  Surgeon: Birder Robson, MD;  Location: [place];  Service: Ophthalmology;  Laterality: Left;  . CHOLECYSTECTOMY N/A 11/14/2016   Procedure: LAPAROSCOPIC CHOLECYSTECTOMY WITH INTRAOPERATIVE CHOLANGIOGRAM;  Surgeon: Robert Bellow,  MD;  Location: [facility];  Service: General;  Laterality: N/A;  . COLONOSCOPY WITH PROPOFOL N/A 03/17/2019   Procedure: COLONOSCOPY WITH PROPOFOL;  Surgeon: Virgel Manifold, MD;  Location: [facility];  Service: Endoscopy;  Laterality: N/A;    Current Outpatient Medications on File Prior to Visit  Medication Sig Dispense Refill  . acetaminophen (TYLENOL) 325 MG tablet Take 650 mg by mouth every 6 (six) hours as needed for mild pain or headache.     . alendronate (FOSAMAX) 70 MG tablet Take 1 tablet (70 mg total) by mouth every 7 (seven) days. Take with a full glass of water on an empty stomach. 4 tablet 11  . aspirin EC 81 MG tablet Take 81 mg by mouth daily.    Marland Kitchen atorvastatin (LIPITOR) 20 MG tablet Take 20 mg by mouth at bedtime.    . Cholecalciferol (VITAMIN D) 125 MCG (5000 UT) CAPS 1  Capsule daily 30 capsule 11  . clopidogrel (PLAVIX) 75 MG tablet Take 1 tablet (75 mg total) by mouth daily. 30 tablet 6  . glucose blood test strip DX E11.9  Check blood sugar daily 100 each 12  . levothyroxine (SYNTHROID, LEVOTHROID) 50 MCG tablet Take 50 mcg by mouth daily before breakfast.    . lisinopril (ZESTRIL) 20 MG tablet Take 1 tablet by mouth once daily 90 tablet 0  . losartan-hydrochlorothiazide (HYZAAR) 100-12.5 MG tablet Take 1 tablet by mouth daily.    . metFORMIN (GLUCOPHAGE) 500 MG tablet Take 1 tablet by mouth twice daily 180 tablet 0   No current facility-administered medications on file prior to visit.     No  Known Allergies  Social History   Occupational History  . Not on file  Tobacco Use  . Smoking status: Never Smoker  . Smokeless tobacco: Never Used  Vaping Use  . Vaping Use: Never used  Substance and Sexual Activity  . Alcohol use: No  . Drug use: No  . Sexual activity: Not on file    Family History  Problem Relation Age of Onset  . Breast cancer Mother 47  . Breast cancer Sister 47  . Breast cancer Sister 4  . Breast cancer Sister 48  . Colon cancer  Neg Hx     Immunization History  Administered Date(s) Administered  . PFIZER SARS-COV-2 Vaccination 06/08/2019, 06/29/2019, 02/16/2020    Review of systems: Positive Findings in bold print.  Constitutional:  chills, fatigue, fever, sweats, weight change Communication: Optometrist, sign Ecologist, hand writing, iPad/Android device Head: headaches, head injury Eyes: changes in vision, eye pain, glaucoma, cataracts, macular degeneration, diplopia, glare,  light sensitivity, eyeglasses or contacts, blindness Ears nose mouth throat: hearing impaired, hearing aids,  ringing in ears, deaf, sign language,  vertigo, nosebleeds,  rhinitis,  cold sores, snoring, swollen glands Cardiovascular: HTN, edema, arrhythmia, pacemaker in place, defibrillator in place, chest pain/tightness, chronic anticoagulation, blood clot, heart failure, MI Peripheral Vascular: leg cramps, varicose veins, blood clots, lymphedema, varicosities Respiratory:  asthma, difficulty breathing, denies congestion, SOB, wheezing, cough, emphysema Gastrointestinal: change in appetite or weight, abdominal pain, constipation, diarrhea, nausea, vomiting, vomiting blood, change in bowel habits, abdominal pain, jaundice, rectal bleeding, hemorrhoids, GERD Genitourinary:  nocturia,  pain on urination, polyuria,  blood in urine, Foley catheter, urinary urgency, ESRD on hemodialysis Musculoskeletal: amputation, cramping, stiff joints, painful joints, decreased joint motion, fractures, OA, gout, hemiplegia, paraplegia, uses cane, wheelchair bound, uses walker, uses rollator Skin: +changes in toenails, color change, dryness, itching, mole changes,  rash, wound(s) Neurological: headaches, numbness in feet, paresthesias in feet, burning in feet, fainting,  seizures, change in speech, migraines, memory problems/poor historian, cerebral palsy, weakness, paralysis, CVA, TIA Endocrine: diabetes, hypothyroidism, hyperthyroidism,  goiter, dry  mouth, flushing, heat intolerance, cold intolerance,  excessive thirst, denies polyuria,  nocturia Hematological:  easy bleeding, excessive bleeding, easy bruising, enlarged lymph nodes, on long term blood thinner, history of past transusions Allergy/immunological:  hives, eczema, frequent infections, multiple drug allergies, seasonal allergies, transplant recipient, multiple food allergies Psychiatric:  anxiety, depression, mood disorder, suicidal ideations, hallucinations, insomnia  Objective: There were no vitals filed for this visit. Vascular Examination: Capillary refill time less than 3 seconds x 10 digits.  Dorsalis pedis pulses palpable 2 out of 4.  Posterior tibial pulses palpable 2 out of 4.  Digital hair not present x 10 digits.  Skin temperature gradient WNL b/l.  Dermatological Examination: Skin with normal turgor, texture and tone b/l  Toenails 1-5 b/l discolored, thick, dystrophic with subungual debris and pain with palpation to nailbeds due to thickness of nails.  Hammertoe contractures noted about 2-5 bilaterally  Musculoskeletal: Muscle strength 5/5 to all LE muscle groups.  Neurological: Sensation intact with 10 gram monofilament.  Vibratory sensation diminished  Assessment: 1. NIDDM 2. Encounter for diabetic foot examination  Plan: 1. Discussed diabetic foot care principles. Literature dispensed on today. 2. Patient to continue soft, supportive shoe gear daily. 3. Patient to report any pedal injuries to medical professional immediately. 4. Follow up one year. 5. Patient/POA to call should there be a concern in the interim. 6. Given that patient has beginning signs of neuropathy as well as underlying hammertoe  contractures of 2 through 5 I believe she will benefit from diabetic shoes.  Prescription for diabetic shoes/inserts were given to the patient.  Patient states understanding

## 2020-06-01 ENCOUNTER — Ambulatory Visit (INDEPENDENT_AMBULATORY_CARE_PROVIDER_SITE_OTHER): Payer: Medicare Other | Admitting: Vascular Surgery

## 2020-06-01 ENCOUNTER — Encounter (INDEPENDENT_AMBULATORY_CARE_PROVIDER_SITE_OTHER): Payer: Medicare Other

## 2020-06-29 ENCOUNTER — Other Ambulatory Visit: Payer: Self-pay | Admitting: Internal Medicine

## 2020-07-23 ENCOUNTER — Other Ambulatory Visit: Payer: Self-pay | Admitting: *Deleted

## 2020-07-23 MED ORDER — LOSARTAN POTASSIUM-HCTZ 100-12.5 MG PO TABS: 1.0000 | ORAL_TABLET | Freq: Every day | ORAL | 3 refills | Status: DC

## 2020-08-09 ENCOUNTER — Other Ambulatory Visit: Payer: Self-pay | Admitting: Internal Medicine

## 2020-09-17 ENCOUNTER — Other Ambulatory Visit: Payer: Self-pay

## 2020-09-17 ENCOUNTER — Other Ambulatory Visit (INDEPENDENT_AMBULATORY_CARE_PROVIDER_SITE_OTHER): Payer: Medicare Other

## 2020-09-17 DIAGNOSIS — I6523 Occlusion and stenosis of bilateral carotid arteries: Secondary | ICD-10-CM

## 2020-09-17 DIAGNOSIS — E119 Type 2 diabetes mellitus without complications: Secondary | ICD-10-CM | POA: Diagnosis not present

## 2020-09-17 DIAGNOSIS — I10 Essential (primary) hypertension: Secondary | ICD-10-CM | POA: Diagnosis not present

## 2020-09-18 LAB — COMPLETE METABOLIC PANEL WITH GFR
AG Ratio: 1.7 (calc) (ref 1.0–2.5)
ALT: 21 U/L (ref 6–29)
AST: 20 U/L (ref 10–35)
Albumin: 4.5 g/dL (ref 3.6–5.1)
Alkaline phosphatase (APISO): 42 U/L (ref 37–153)
BUN: 14 mg/dL (ref 7–25)
CO2: 25 mmol/L (ref 20–32)
Calcium: 9.4 mg/dL (ref 8.6–10.4)
Chloride: 91 mmol/L — ABNORMAL LOW (ref 98–110)
Creat: 0.66 mg/dL (ref 0.50–0.99)
GFR, Est African American: 105 mL/min/{1.73_m2} (ref >=60)
GFR, Est Non African American: 91 mL/min/{1.73_m2} (ref >=60)
Globulin: 2.7 g/dL (calc) (ref 1.9–3.7)
Glucose, Bld: 112 mg/dL — ABNORMAL HIGH (ref 65–99)
Potassium: 4.5 mmol/L (ref 3.5–5.3)
Sodium: 125 mmol/L — ABNORMAL LOW (ref 135–146)
Total Bilirubin: 0.5 mg/dL (ref 0.2–1.2)
Total Protein: 7.2 g/dL (ref 6.1–8.1)

## 2020-09-18 LAB — CBC WITH DIFFERENTIAL/PLATELET
Absolute Monocytes: 525 cells/uL (ref 200–950)
Basophils Absolute: 50 cells/uL (ref 0–200)
Basophils Relative: 0.7 %
Eosinophils Absolute: 213 cells/uL (ref 15–500)
Eosinophils Relative: 3 %
HCT: 36.1 % (ref 35.0–45.0)
Hemoglobin: 12 g/dL (ref 11.7–15.5)
Lymphs Abs: 1441 cells/uL (ref 850–3900)
MCH: 29.3 pg (ref 27.0–33.0)
MCHC: 33.2 g/dL (ref 32.0–36.0)
MCV: 88.3 fL (ref 80.0–100.0)
MPV: 9.9 fL (ref 7.5–12.5)
Monocytes Relative: 7.4 %
Neutro Abs: 4871 cells/uL (ref 1500–7800)
Neutrophils Relative %: 68.6 %
Platelets: 289 10*3/uL (ref 140–400)
RBC: 4.09 10*6/uL (ref 3.80–5.10)
RDW: 12.2 % (ref 11.0–15.0)
Total Lymphocyte: 20.3 %
WBC: 7.1 10*3/uL (ref 3.8–10.8)

## 2020-09-18 LAB — HEMOGLOBIN A1C
Hgb A1c MFr Bld: 6.4 % of total Hgb — ABNORMAL HIGH (ref <5.7)
Mean Plasma Glucose: 137 mg/dL
eAG (mmol/L): 7.6 mmol/L

## 2020-09-18 LAB — LIPID PANEL
Cholesterol: 117 mg/dL (ref <200)
HDL: 53 mg/dL (ref >=50)
LDL Cholesterol (Calc): 51 mg/dL (calc)
Non-HDL Cholesterol (Calc): 64 mg/dL (calc) (ref <130)
Total CHOL/HDL Ratio: 2.2 (calc) (ref <5.0)
Triglycerides: 46 mg/dL (ref <150)

## 2020-09-18 LAB — TSH: TSH: 1.02 mIU/L (ref 0.40–4.50)

## 2020-09-28 ENCOUNTER — Other Ambulatory Visit: Payer: Self-pay | Admitting: Internal Medicine

## 2020-09-28 ENCOUNTER — Ambulatory Visit
Admission: RE | Admit: 2020-09-28 | Discharge: 2020-09-28 | Disposition: A | Discharge status: Home / Self Care | Payer: Medicare Other | Source: Ambulatory Visit | Attending: Internal Medicine | Admitting: Internal Medicine

## 2020-09-28 ENCOUNTER — Other Ambulatory Visit: Payer: Self-pay

## 2020-09-28 DIAGNOSIS — M7989 Other specified soft tissue disorders: Secondary | ICD-10-CM | POA: Diagnosis not present

## 2020-09-28 DIAGNOSIS — M79661 Pain in right lower leg: Secondary | ICD-10-CM | POA: Insufficient documentation

## 2020-09-28 DIAGNOSIS — M79604 Pain in right leg: Secondary | ICD-10-CM

## 2020-09-28 DIAGNOSIS — M79662 Pain in left lower leg: Secondary | ICD-10-CM | POA: Insufficient documentation

## 2020-10-08 ENCOUNTER — Other Ambulatory Visit: Payer: Self-pay

## 2020-10-08 MED ORDER — LOSARTAN POTASSIUM 50 MG PO TABS: 50.0000 mg | ORAL_TABLET | Freq: Every day | ORAL | 1 refills | Status: DC

## 2020-10-08 MED ORDER — AMLODIPINE BESYLATE 5 MG PO TABS: 5.0000 mg | ORAL_TABLET | Freq: Every day | ORAL | 1 refills | Status: DC

## 2020-10-08 MED ORDER — CLOPIDOGREL BISULFATE 75 MG PO TABS
75.0000 mg | ORAL_TABLET | Freq: Every day | ORAL | 6 refills | Status: DC
Start: 2020-10-08 — End: 2023-09-08

## 2020-10-11 ENCOUNTER — Other Ambulatory Visit: Payer: Medicare Other

## 2020-10-12 ENCOUNTER — Other Ambulatory Visit: Payer: Self-pay

## 2020-10-12 ENCOUNTER — Other Ambulatory Visit: Payer: Medicare Other

## 2020-10-12 MED ORDER — ATORVASTATIN CALCIUM 20 MG PO TABS
20.0000 mg | ORAL_TABLET | Freq: Every day | ORAL | 3 refills | Status: DC
Start: 2020-10-12 — End: 2021-08-01

## 2020-10-12 MED ORDER — LEVOTHYROXINE SODIUM 50 MCG PO TABS: 50.0000 ug | ORAL_TABLET | Freq: Every day | ORAL | 3 refills | Status: DC

## 2020-10-12 MED ORDER — ALENDRONATE SODIUM 70 MG PO TABS: 70.0000 mg | ORAL_TABLET | ORAL | 11 refills | Status: DC

## 2020-10-19 ENCOUNTER — Other Ambulatory Visit: Payer: Self-pay

## 2020-10-19 ENCOUNTER — Ambulatory Visit (INDEPENDENT_AMBULATORY_CARE_PROVIDER_SITE_OTHER): Payer: Medicare Other | Admitting: Internal Medicine

## 2020-10-19 ENCOUNTER — Encounter: Payer: Self-pay | Admitting: Internal Medicine

## 2020-10-19 ENCOUNTER — Ambulatory Visit: Payer: Medicare Other | Admitting: Internal Medicine

## 2020-10-19 VITALS — BP 157/62 | HR 56 | Ht 60.0 in | Wt 127.0 lb

## 2020-10-19 DIAGNOSIS — I6523 Occlusion and stenosis of bilateral carotid arteries: Secondary | ICD-10-CM

## 2020-10-19 DIAGNOSIS — M1712 Unilateral primary osteoarthritis, left knee: Secondary | ICD-10-CM

## 2020-10-19 DIAGNOSIS — I10 Essential (primary) hypertension: Secondary | ICD-10-CM | POA: Diagnosis not present

## 2020-10-19 NOTE — Assessment & Plan Note (Signed)
Stable

## 2020-10-19 NOTE — Progress Notes (Signed)
Established Patient Office Visit  Subjective:  Patient ID: Sheena Parker, female    DOB: 1950/06/28  Age: 69 y.o. MRN: [id number]  CC:  Chief Complaint  Patient presents with   Leg Pain    Patient complains of left leg pain.    Leg Pain  The incident occurred more than 1 week ago. There was no injury mechanism. The pain is present in the left leg, left knee and right knee. The quality of the pain is described as aching. The pain is at a severity of 4/10. The pain is mild. The pain has been Constant since onset. Pertinent negatives include no inability to bear weight, loss of motion, muscle weakness, numbness or tingling. She reports no foreign bodies present. She has tried acetaminophen, heat, immobilization, NSAIDs and elevation for the symptoms. The treatment provided mild relief.   Sheena Parker presents for general checkup.  She complains of pain in the left knee right knee and left leg and right leg, peripheral circulation is intact with ABI,  Past Medical History:  Diagnosis Date   Anemia 02/16/2020   Arthritis    KNEE, hands   Diabetes mellitus without complication ([place])    Difficult intubation    Gall stones 10/31/2016   GERD (gastroesophageal reflux disease)    NO MEDS   Hyperlipidemia    Hypertension    Hypothyroidism    Presence of dental prosthetic device    Dental implants,  top - front 2 teeth   Thyroid disease    Weight decrease 02/16/2020   Weight loss 02/16/2020    Past Surgical History:  Procedure Laterality Date   CAROTID PTA/STENT INTERVENTION Left 10/30/2019   Procedure: CAROTID PTA/STENT INTERVENTION;  Surgeon: Algernon Huxley, MD;  Location: [place] [facility];  Service: Cardiovascular;  Laterality: Left;   CATARACT EXTRACTION W/PHACO Right 12/16/2019   Procedure: CATARACT EXTRACTION PHACO AND INTRAOCULAR LENS PLACEMENT (IOC) RIGHT DIABETIC 7.57 00:55.0;  Surgeon: Birder Robson, MD;  Location: [place];  Service: Ophthalmology;  Laterality:  Right;  Diabetic - oral meds   CATARACT EXTRACTION W/PHACO Left 01/06/2020   Procedure: CATARACT EXTRACTION PHACO AND INTRAOCULAR LENS PLACEMENT (IOC) LEFT DIABETIC 7.12  00:44.5;  Surgeon: Birder Robson, MD;  Location: [place];  Service: Ophthalmology;  Laterality: Left;   CHOLECYSTECTOMY N/A 11/14/2016   Procedure: LAPAROSCOPIC CHOLECYSTECTOMY WITH INTRAOPERATIVE CHOLANGIOGRAM;  Surgeon: Robert Bellow, MD;  Location: [facility];  Service: General;  Laterality: N/A;   COLONOSCOPY WITH PROPOFOL N/A 03/17/2019   Procedure: COLONOSCOPY WITH PROPOFOL;  Surgeon: Virgel Manifold, MD;  Location: [facility];  Service: Endoscopy;  Laterality: N/A;    Family History  Problem Relation Age of Onset   Breast cancer Mother 8   Breast cancer Sister 9   Breast cancer Sister 68   Breast cancer Sister 35   Colon cancer Neg Hx     Social History   Socioeconomic History   Marital status: Married    Spouse name: Not on file   Number of children: Not on file   Years of education: Not on file   Highest education level: Not on file  Occupational History   Not on file  Tobacco Use   Smoking status: Never   Smokeless tobacco: Never  Vaping Use   Vaping Use: Never used  Substance and Sexual Activity   Alcohol use: No   Drug use: No   Sexual activity: Not on file  Other Topics Concern   Not on file  Social History Narrative   Not on file   Social Determinants of Health   Financial Resource Strain: Not on file  Food Insecurity: Not on file  Transportation Needs: Not on file  Physical Activity: Not on file  Stress: Not on file  Social Connections: Not on file  Intimate Partner Violence: Not on file     Current Outpatient Medications:    acetaminophen (TYLENOL) 325 MG tablet, Take 650 mg by mouth every 6 (six) hours as needed for mild pain or headache. , Disp: , Rfl:    alendronate (FOSAMAX) 70 MG tablet, Take 1 tablet (70 mg total) by mouth every 7 (seven) days.  Take with a full glass of water on an empty stomach., Disp: 4 tablet, Rfl: 11   amLODipine (NORVASC) 5 MG tablet, Take 1 tablet (5 mg total) by mouth daily., Disp: 90 tablet, Rfl: 1   aspirin EC 81 MG tablet, Take 81 mg by mouth daily., Disp: , Rfl:    atorvastatin (LIPITOR) 20 MG tablet, Take 1 tablet (20 mg total) by mouth at bedtime., Disp: 90 tablet, Rfl: 3   Cholecalciferol (VITAMIN D) 125 MCG (5000 UT) CAPS, 1  Capsule daily, Disp: 30 capsule, Rfl: 11   clopidogrel (PLAVIX) 75 MG tablet, Take 1 tablet (75 mg total) by mouth daily., Disp: 30 tablet, Rfl: 6   gabapentin (NEURONTIN) 100 MG capsule, Take 1 capsule (100 mg total) by mouth 3 (three) times daily., Disp: 90 capsule, Rfl: 3   glucose blood test strip, DX E11.9  Check blood sugar daily, Disp: 100 each, Rfl: 12   levothyroxine (SYNTHROID) 50 MCG tablet, Take 1 tablet (50 mcg total) by mouth daily before breakfast., Disp: 90 tablet, Rfl: 3   losartan (COZAAR) 50 MG tablet, Take 1 tablet (50 mg total) by mouth daily., Disp: 90 tablet, Rfl: 1   metFORMIN (GLUCOPHAGE) 500 MG tablet, Take 1 tablet by mouth twice daily, Disp: 180 tablet, Rfl: 0   No Known Allergies  ROS Review of Systems  Constitutional: Negative.   HENT: Negative.    Eyes: Negative.   Respiratory: Negative.    Cardiovascular: Negative.   Gastrointestinal: Negative.   Endocrine: Negative.   Genitourinary: Negative.   Musculoskeletal: Negative.   Skin: Negative.   Allergic/Immunologic: Negative.   Neurological: Negative.  Negative for tingling and numbness.  Hematological: Negative.   Psychiatric/Behavioral: Negative.    All other systems reviewed and are negative.    Objective:    Physical Exam Vitals reviewed.  Constitutional:      Appearance: Normal appearance.  HENT:     Mouth/Throat:     Mouth: Mucous membranes are moist.  Eyes:     Pupils: Pupils are equal, round, and reactive to light.  Neck:     Vascular: No carotid bruit.  Cardiovascular:      Rate and Rhythm: Normal rate and regular rhythm.     Pulses: Normal pulses.     Heart sounds: Normal heart sounds.  Pulmonary:     Effort: Pulmonary effort is normal.     Breath sounds: Normal breath sounds.  Abdominal:     General: Bowel sounds are normal.     Palpations: Abdomen is soft. There is no hepatomegaly, splenomegaly or mass.     Tenderness: There is no abdominal tenderness.     Hernia: No hernia is present.  Musculoskeletal:        General: No tenderness.     Cervical back: Neck supple.     Right  lower leg: No edema.     Left lower leg: No edema.     Comments: Tenderness left knee joint with evidence of fluid in the left knee joint  Skin:    Findings: No rash.  Neurological:     General: No focal deficit present.     Mental Status: She is alert and oriented to person, place, and time. Mental status is at baseline.     Motor: No weakness.     Gait: Gait normal.  Psychiatric:        Mood and Affect: Mood and affect normal.        Behavior: Behavior normal.    BP (!) 157/62   Pulse (!) 56   Ht 5' (1.524 m)   Wt 127 lb (57.6 kg)   BMI 24.80 kg/m  Wt Readings from Last 3 Encounters:  10/19/20 127 lb (57.6 kg)  04/23/20 125 lb 8 oz (56.9 kg)  04/22/20 126 lb 12.8 oz (57.5 kg)     Health Maintenance Due  Topic Date Due   FOOT EXAM  Never done   Hepatitis C Screening  Never done   TETANUS/TDAP  Never done   Zoster Vaccines- Shingrix (1 of 2) Never done   DEXA SCAN  Never done   PNA vac Low Risk Adult (1 of 2 - PCV13) Never done   MAMMOGRAM  08/16/2019   COVID-19 Vaccine (4 - Booster for Pfizer series) 06/18/2020    There are no preventive care reminders to display for this patient.  Lab Results  Component Value Date   TSH 1.02 09/17/2020   Lab Results  Component Value Date   WBC 7.1 09/17/2020   HGB 12.0 09/17/2020   HCT 36.1 09/17/2020   MCV 88.3 09/17/2020   PLT 289 09/17/2020   Lab Results  Component Value Date   NA 125 (L) 09/17/2020    K 4.5 09/17/2020   CO2 25 09/17/2020   GLUCOSE 112 (H) 09/17/2020   BUN 14 09/17/2020   CREATININE 0.66 09/17/2020   BILITOT 0.5 09/17/2020   AST 20 09/17/2020   ALT 21 09/17/2020   PROT 7.2 09/17/2020   CALCIUM 9.4 09/17/2020   ANIONGAP 5 10/31/2019   Lab Results  Component Value Date   CHOL 117 09/17/2020   Lab Results  Component Value Date   HDL 53 09/17/2020   Lab Results  Component Value Date   LDLCALC 51 09/17/2020   Lab Results  Component Value Date   TRIG 46 09/17/2020   Lab Results  Component Value Date   CHOLHDL 2.2 09/17/2020   Lab Results  Component Value Date   HGBA1C 6.4 (H) 09/17/2020      Assessment & Plan:   Problem List Items Addressed This Visit       Cardiovascular and Mediastinum   Essential hypertension - Primary    Stable       Carotid stenosis    No dizziness or TIA         Musculoskeletal and Integument   Primary osteoarthritis of left knee    40 mg of Kenalog was injected in the left knee under local anesthesia of 1% lidocaine with epinephrine, patient tolerated the procedure well.       Joint Injection/Arthrocentesis  Date/Time: 10/19/2020 2:31 PM Performed by: Cletis Athens, MD Authorized by: Cletis Athens, MD  Indications: joint swelling and pain  Body area: knee Joint: left knee Local anesthesia used: yes Anesthesia: see MAR for details  Anesthesia: Local anesthesia  used: yes Local Anesthetic: lidocaine 1% with epinephrine Preparation: Patient was prepped and draped in the usual sterile fashion. Needle size: 22 G Approach: lateral Betamethasone amount: 40 mg Patient tolerance: patient tolerated the procedure well with no immediate complications Comments: Left knee was prepared with alcohol 40 mg of Kenalog +1 cc of lidocaine with epinephrine 1% was injected on the left side, patient tolerated the procedure well and she was advised to sit in the waiting room for 5 minutes      No orders of the defined  types were placed in this encounter.   Follow-up: No follow-ups on file.    Cletis Athens, MD

## 2020-10-19 NOTE — Assessment & Plan Note (Signed)
No dizziness or TIA

## 2020-10-19 NOTE — Assessment & Plan Note (Signed)
40 mg of Kenalog was injected in the left knee under local anesthesia of 1% lidocaine with epinephrine, patient tolerated the procedure well.

## 2020-11-03 ENCOUNTER — Other Ambulatory Visit: Payer: Self-pay | Admitting: Internal Medicine

## 2021-01-19 DIAGNOSIS — E039 Hypothyroidism, unspecified: Secondary | ICD-10-CM | POA: Diagnosis not present

## 2021-01-19 DIAGNOSIS — Z131 Encounter for screening for diabetes mellitus: Secondary | ICD-10-CM | POA: Diagnosis not present

## 2021-01-21 ENCOUNTER — Other Ambulatory Visit: Payer: Self-pay | Admitting: Family Medicine

## 2021-01-21 DIAGNOSIS — Z1231 Encounter for screening mammogram for malignant neoplasm of breast: Secondary | ICD-10-CM

## 2021-01-25 ENCOUNTER — Other Ambulatory Visit: Payer: Self-pay | Admitting: Internal Medicine

## 2021-01-25 MED ORDER — LOSARTAN POTASSIUM 50 MG PO TABS: 50.0000 mg | ORAL_TABLET | Freq: Two times a day (BID) | ORAL | 1 refills | Status: DC

## 2021-01-26 ENCOUNTER — Encounter: Payer: Self-pay | Admitting: Internal Medicine

## 2021-01-26 ENCOUNTER — Ambulatory Visit (INDEPENDENT_AMBULATORY_CARE_PROVIDER_SITE_OTHER): Payer: Medicare Other | Admitting: Internal Medicine

## 2021-01-26 ENCOUNTER — Other Ambulatory Visit: Payer: Self-pay

## 2021-01-26 VITALS — BP 172/70 | HR 65 | Ht 60.0 in | Wt 133.1 lb

## 2021-01-26 DIAGNOSIS — R0989 Other specified symptoms and signs involving the circulatory and respiratory systems: Secondary | ICD-10-CM | POA: Diagnosis not present

## 2021-01-26 DIAGNOSIS — I10 Essential (primary) hypertension: Secondary | ICD-10-CM

## 2021-01-26 DIAGNOSIS — E119 Type 2 diabetes mellitus without complications: Secondary | ICD-10-CM

## 2021-01-26 DIAGNOSIS — I6523 Occlusion and stenosis of bilateral carotid arteries: Secondary | ICD-10-CM | POA: Diagnosis not present

## 2021-01-26 LAB — POCT URINALYSIS DIPSTICK
Appearance: NORMAL
Bilirubin, UA: NEGATIVE
Blood, UA: NEGATIVE
Glucose, UA: NEGATIVE
Nitrite, UA: NEGATIVE
Protein, UA: NEGATIVE
Spec Grav, UA: 1.01 (ref 1.010–1.025)
Urobilinogen, UA: NEGATIVE E.U./dL — AB
pH, UA: 6 (ref 5.0–8.0)

## 2021-01-26 NOTE — Assessment & Plan Note (Signed)
Patient has a stent on the left side

## 2021-01-26 NOTE — Progress Notes (Signed)
Established Patient Office Visit  Subjective:  Patient ID: Sheena Parker, female    DOB: 09-21-1950  Age: 70 y.o. MRN: [id number]  CC:  Chief Complaint  Patient presents with   Hypertension    Hypertension   [facility] presents for follow labile blood pressure  Past Medical History:  Diagnosis Date   Anemia 02/16/2020   Arthritis    KNEE, hands   Diabetes mellitus without complication ([place])    Difficult intubation    Gall stones 10/31/2016   GERD (gastroesophageal reflux disease)    NO MEDS   Hyperlipidemia    Hypertension    Hypothyroidism    Presence of dental prosthetic device    Dental implants,  top - front 2 teeth   Thyroid disease    Weight decrease 02/16/2020   Weight loss 02/16/2020    Past Surgical History:  Procedure Laterality Date   CAROTID PTA/STENT INTERVENTION Left 10/30/2019   Procedure: CAROTID PTA/STENT INTERVENTION;  Surgeon: Algernon Huxley, MD;  Location: [place] [facility];  Service: Cardiovascular;  Laterality: Left;   CATARACT EXTRACTION W/PHACO Right 12/16/2019   Procedure: CATARACT EXTRACTION PHACO AND INTRAOCULAR LENS PLACEMENT (IOC) RIGHT DIABETIC 7.57 00:55.0;  Surgeon: Birder Robson, MD;  Location: [place];  Service: Ophthalmology;  Laterality: Right;  Diabetic - oral meds   CATARACT EXTRACTION W/PHACO Left 01/06/2020   Procedure: CATARACT EXTRACTION PHACO AND INTRAOCULAR LENS PLACEMENT (IOC) LEFT DIABETIC 7.12  00:44.5;  Surgeon: Birder Robson, MD;  Location: [place];  Service: Ophthalmology;  Laterality: Left;   CHOLECYSTECTOMY N/A 11/14/2016   Procedure: LAPAROSCOPIC CHOLECYSTECTOMY WITH INTRAOPERATIVE CHOLANGIOGRAM;  Surgeon: Robert Bellow, MD;  Location: [facility];  Service: General;  Laterality: N/A;   COLONOSCOPY WITH PROPOFOL N/A 03/17/2019   Procedure: COLONOSCOPY WITH PROPOFOL;  Surgeon: Virgel Manifold, MD;  Location: [facility];  Service: Endoscopy;  Laterality: N/A;    Family History   Problem Relation Age of Onset   Breast cancer Mother 38   Breast cancer Sister 71   Breast cancer Sister 58   Breast cancer Sister 84   Colon cancer Neg Hx     Social History   Socioeconomic History   Marital status: Married    Spouse name: Not on file   Number of children: Not on file   Years of education: Not on file   Highest education level: Not on file  Occupational History   Not on file  Tobacco Use   Smoking status: Never   Smokeless tobacco: Never  Vaping Use   Vaping Use: Never used  Substance and Sexual Activity   Alcohol use: No   Drug use: No   Sexual activity: Not on file  Other Topics Concern   Not on file  Social History Narrative   Not on file   Social Determinants of Health   Financial Resource Strain: Not on file  Food Insecurity: Not on file  Transportation Needs: Not on file  Physical Activity: Not on file  Stress: Not on file  Social Connections: Not on file  Intimate Partner Violence: Not on file     Current Outpatient Medications:    acetaminophen (TYLENOL) 325 MG tablet, Take 650 mg by mouth every 6 (six) hours as needed for mild pain or headache. , Disp: , Rfl:    alendronate (FOSAMAX) 70 MG tablet, Take 1 tablet (70 mg total) by mouth every 7 (seven) days. Take with a full glass of water on an empty stomach., Disp:  4 tablet, Rfl: 11   amLODipine (NORVASC) 5 MG tablet, Take 1 tablet (5 mg total) by mouth daily., Disp: 90 tablet, Rfl: 1   aspirin EC 81 MG tablet, Take 81 mg by mouth daily., Disp: , Rfl:    atorvastatin (LIPITOR) 20 MG tablet, Take 1 tablet (20 mg total) by mouth at bedtime., Disp: 90 tablet, Rfl: 3   Cholecalciferol (VITAMIN D) 125 MCG (5000 UT) CAPS, 1  Capsule daily, Disp: 30 capsule, Rfl: 11   clopidogrel (PLAVIX) 75 MG tablet, Take 1 tablet (75 mg total) by mouth daily., Disp: 30 tablet, Rfl: 6   gabapentin (NEURONTIN) 100 MG capsule, Take 1 capsule (100 mg total) by mouth 3 (three) times daily., Disp: 90 capsule, Rfl:  3   glucose blood test strip, DX E11.9  Check blood sugar daily, Disp: 100 each, Rfl: 12   levothyroxine (SYNTHROID) 50 MCG tablet, Take 1 tablet (50 mcg total) by mouth daily before breakfast., Disp: 90 tablet, Rfl: 3   losartan (COZAAR) 50 MG tablet, Take 1 tablet (50 mg total) by mouth 2 (two) times daily., Disp: 180 tablet, Rfl: 1   metFORMIN (GLUCOPHAGE) 500 MG tablet, Take 1 tablet by mouth twice daily, Disp: 180 tablet, Rfl: 0   No Known Allergies  ROS Review of Systems  Constitutional: Negative.   HENT: Negative.    Eyes: Negative.   Respiratory: Negative.    Cardiovascular: Negative.   Gastrointestinal: Negative.   Endocrine: Negative.   Genitourinary: Negative.   Musculoskeletal: Negative.   Skin: Negative.   Allergic/Immunologic: Negative.   Neurological: Negative.   Hematological: Negative.   Psychiatric/Behavioral: Negative.    All other systems reviewed and are negative.    Objective:    Physical Exam Vitals reviewed.  Constitutional:      Appearance: Normal appearance.  HENT:     Mouth/Throat:     Mouth: Mucous membranes are moist.  Eyes:     Pupils: Pupils are equal, round, and reactive to light.  Neck:     Vascular: No carotid bruit.  Cardiovascular:     Rate and Rhythm: Normal rate and regular rhythm.     Pulses: Normal pulses.     Heart sounds: Normal heart sounds.  Pulmonary:     Effort: Pulmonary effort is normal.     Breath sounds: Normal breath sounds.  Abdominal:     General: Bowel sounds are normal.     Palpations: Abdomen is soft. There is no hepatomegaly, splenomegaly or mass.     Tenderness: There is no abdominal tenderness.     Hernia: No hernia is present.  Musculoskeletal:        General: No tenderness.     Cervical back: Neck supple.     Right lower leg: No edema.     Left lower leg: No edema.  Skin:    Findings: No rash.  Neurological:     Mental Status: She is alert and oriented to person, place, and time.     Motor: No  weakness.  Psychiatric:        Mood and Affect: Mood and affect normal.        Behavior: Behavior normal.    BP (!) 172/70   Pulse 65   Ht 5' (1.524 m)   Wt 133 lb 1.6 oz (60.4 kg)   BMI 25.99 kg/m  Wt Readings from Last 3 Encounters:  01/26/21 133 lb 1.6 oz (60.4 kg)  10/19/20 127 lb (57.6 kg)  04/23/20 125 lb  8 oz (56.9 kg)     Health Maintenance Due  Topic Date Due   FOOT EXAM  Never done   Hepatitis C Screening  Never done   TETANUS/TDAP  Never done   Zoster Vaccines- Shingrix (1 of 2) Never done   DEXA SCAN  Never done   MAMMOGRAM  08/16/2019   COVID-19 Vaccine (4 - Booster for Pfizer series) 06/18/2020   OPHTHALMOLOGY EXAM  12/02/2020   INFLUENZA VACCINE  12/06/2020    There are no preventive care reminders to display for this patient.  Lab Results  Component Value Date   TSH 1.02 09/17/2020   Lab Results  Component Value Date   WBC 7.1 09/17/2020   HGB 12.0 09/17/2020   HCT 36.1 09/17/2020   MCV 88.3 09/17/2020   PLT 289 09/17/2020   Lab Results  Component Value Date   NA 125 (L) 09/17/2020   K 4.5 09/17/2020   CO2 25 09/17/2020   GLUCOSE 112 (H) 09/17/2020   BUN 14 09/17/2020   CREATININE 0.66 09/17/2020   BILITOT 0.5 09/17/2020   AST 20 09/17/2020   ALT 21 09/17/2020   PROT 7.2 09/17/2020   CALCIUM 9.4 09/17/2020   ANIONGAP 5 10/31/2019   Lab Results  Component Value Date   CHOL 117 09/17/2020   Lab Results  Component Value Date   HDL 53 09/17/2020   Lab Results  Component Value Date   LDLCALC 51 09/17/2020   Lab Results  Component Value Date   TRIG 46 09/17/2020   Lab Results  Component Value Date   CHOLHDL 2.2 09/17/2020   Lab Results  Component Value Date   HGBA1C 6.4 (H) 09/17/2020      Assessment & Plan:   Problem List Items Addressed This Visit       Cardiovascular and Mediastinum   Essential hypertension     Patient denies any chest pain or shortness of breath there is no history of palpitation or paroxysmal  nocturnal dyspnea   patient was advised to follow low-salt low-cholesterol diet    ideally I want to keep systolic blood pressure below 130 mmHg, patient was asked to check blood pressure one times a week and give me a report on that.  Patient will be follow-up in 3 months  or earlier as needed, patient will call me back for any change in the cardiovascular symptoms Patient was advised to buy a book from local bookstore concerning blood pressure and read several chapters  every day.  This will be supplemented by some of the material we will give him from the office.  Patient should also utilize other resources like YouTube and Internet to learn more about the blood pressure and the diet.  Increase losartan to 50 mg p.o. twice a day        Endocrine   Diabetes ([place])    - The patient's blood sugar is labile on med. - The patient will continue the current treatment regimen.  - I encouraged the patient to regularly check blood sugar.  - I encouraged the patient to monitor diet. I encouraged the patient to eat low-carb and low-sugar to help prevent blood sugar spikes.  - I encouraged the patient to continue following their prescribed treatment plan for diabetes - I informed the patient to get help if blood sugar drops below 54mg /dL, or if suddenly have trouble thinking clearly or breathing.  Patient was advised to buy a book on diabetes from a local bookstore or from [place]  Patient should read 2 chapters every day to keep the motivation going, this is in addition to some of the materials we provided them from the office.  There are other resources on the Internet like YouTube and [employer] to get an education on the diabetes        Other   Left carotid bruit    Patient has a stent on the left side      Other Visit Diagnoses     Hypertension, unspecified type    -  Primary   Relevant Orders   COMPLETE METABOLIC PANEL WITH GFR   EKG 12-Lead   POCT urinalysis dipstick (Completed)      Electrocardiogram does not show any acute changes, will repeat Chem-7  No orders of the defined types were placed in this encounter.   Follow-up: No follow-ups on file.    Cletis Athens, MD

## 2021-01-26 NOTE — Assessment & Plan Note (Signed)

## 2021-01-26 NOTE — Assessment & Plan Note (Signed)
Patient denies any chest pain or shortness of breath there is no history of palpitation or paroxysmal nocturnal dyspnea   patient was advised to follow low-salt low-cholesterol diet    ideally I want to keep systolic blood pressure below 130 mmHg, patient was asked to check blood pressure one times a week and give me a report on that.  Patient will be follow-up in 3 months  or earlier as needed, patient will call me back for any change in the cardiovascular symptoms Patient was advised to buy a book from local bookstore concerning blood pressure and read several chapters  every day.  This will be supplemented by some of the material we will give him from the office.  Patient should also utilize other resources like YouTube and Internet to learn more about the blood pressure and the diet.  Increase losartan to 50 mg p.o. twice a day

## 2021-01-27 LAB — COMPLETE METABOLIC PANEL WITH GFR
AG Ratio: 1.6 (calc) (ref 1.0–2.5)
ALT: 19 U/L (ref 6–29)
AST: 21 U/L (ref 10–35)
Albumin: 4.4 g/dL (ref 3.6–5.1)
Alkaline phosphatase (APISO): 42 U/L (ref 37–153)
BUN: 11 mg/dL (ref 7–25)
CO2: 26 mmol/L (ref 20–32)
Calcium: 9.5 mg/dL (ref 8.6–10.4)
Chloride: 88 mmol/L — ABNORMAL LOW (ref 98–110)
Creat: 0.63 mg/dL (ref 0.50–1.05)
Globulin: 2.8 g/dL (calc) (ref 1.9–3.7)
Glucose, Bld: 99 mg/dL (ref 65–99)
Potassium: 4.3 mmol/L (ref 3.5–5.3)
Sodium: 122 mmol/L — ABNORMAL LOW (ref 135–146)
Total Bilirubin: 0.6 mg/dL (ref 0.2–1.2)
Total Protein: 7.2 g/dL (ref 6.1–8.1)
eGFR: 96 mL/min/{1.73_m2} (ref >=60)

## 2021-02-04 ENCOUNTER — Other Ambulatory Visit: Payer: Self-pay | Admitting: Internal Medicine

## 2021-02-04 DIAGNOSIS — E222 Syndrome of inappropriate secretion of antidiuretic hormone: Secondary | ICD-10-CM | POA: Diagnosis not present

## 2021-02-07 LAB — SODIUM: Sodium: 133 mmol/L — ABNORMAL LOW (ref 134–144)

## 2021-02-07 LAB — OSMOLALITY: Osmolality Meas: 272 mOsmol/kg — ABNORMAL LOW (ref 280–301)

## 2021-02-07 LAB — GLUCOSE, RANDOM: Glucose: 136 mg/dL — ABNORMAL HIGH (ref 70–99)

## 2021-02-07 LAB — BUN+CREAT
BUN/Creatinine Ratio: 23 (ref 12–28)
BUN: 16 mg/dL (ref 8–27)
Creatinine, Ser: 0.7 mg/dL (ref 0.57–1.00)
eGFR: 94 mL/min/{1.73_m2} (ref >59)

## 2021-02-07 LAB — SODIUM, URINE, 24 HOUR
Sodium, 24H Ur: 112 mmol/24 hr (ref 39–258)
Sodium, Ur: 72 mmol/L

## 2021-02-07 LAB — OSMOLALITY, URINE: Osmolality, Ur: 377 mOsmol/kg

## 2021-02-07 LAB — CORTISOL: Cortisol: 9.3 ug/dL

## 2021-02-16 DIAGNOSIS — I10 Essential (primary) hypertension: Secondary | ICD-10-CM | POA: Diagnosis not present

## 2021-02-16 DIAGNOSIS — M810 Age-related osteoporosis without current pathological fracture: Secondary | ICD-10-CM | POA: Diagnosis not present

## 2021-02-16 DIAGNOSIS — Z0000 Encounter for general adult medical examination without abnormal findings: Secondary | ICD-10-CM | POA: Diagnosis not present

## 2021-02-21 IMAGING — CT CT ANGIO NECK
3 of 7 series · 8 of 33 positions shown · IV contrast (omnipaque)
Comparison: None.

CLINICAL DATA: Left carotid bruit.  Carotid stenosis by sonography.

EXAM:
CT ANGIOGRAPHY NECK
TECHNIQUE: Multidetector CT imaging of the neck was performed using the
standard protocol during bolus administration of intravenous
contrast. Multiplanar CT image reconstructions and MIPs were
obtained to evaluate the vascular anatomy. Carotid stenosis
measurements (when applicable) are obtained utilizing NASCET
criteria, using the distal internal carotid diameter as the
denominator.
CONTRAST:  75mL OMNIPAQUE IOHEXOL 350 MG/ML SOLN

[Series 5: cta neck cta neck (person_name) 2.00 · axial · 0.45mm/px · z∈[-710,-634]mm · 2 of 114 slices shown]
[im 38/114  soft-tissue]
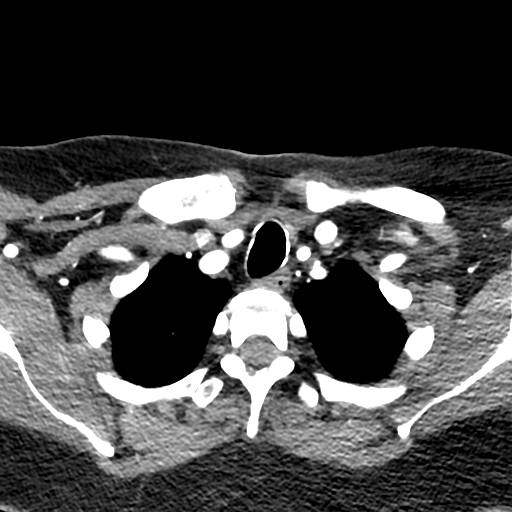
[im 76/114  soft-tissue]
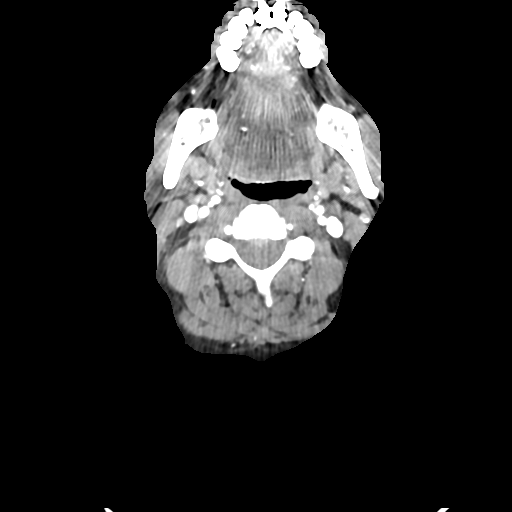

[Series 6: ax thin mips cta neck 1.00 ax · axial · 0.45mm/px · z∈[-747,-595]mm · 5 of 228 slices shown]
[im 38/228  soft-tissue]
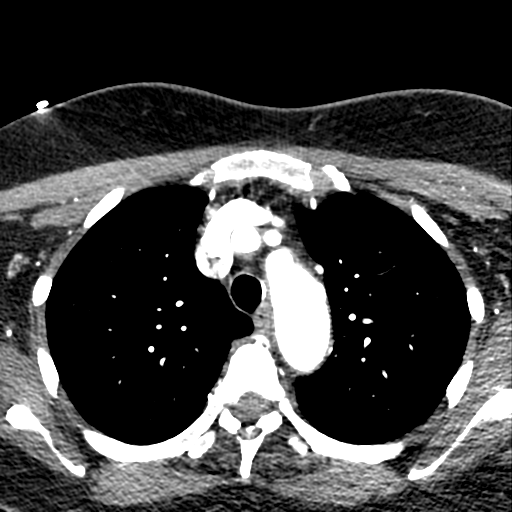
[im 76/228  bone]
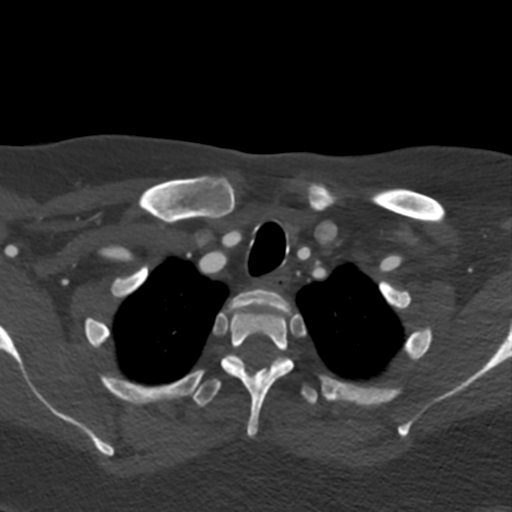
[im 114/228  soft-tissue]
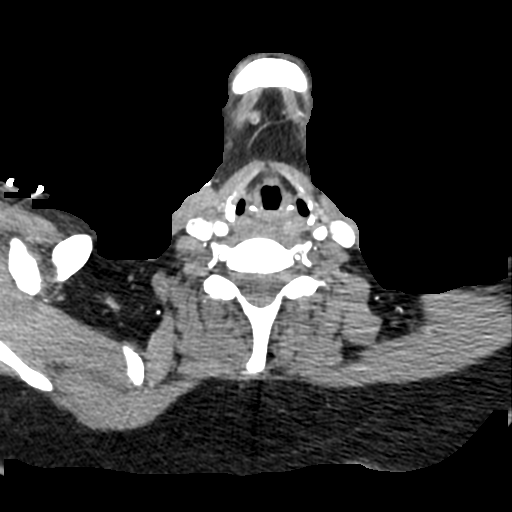
[im 152/228  bone]
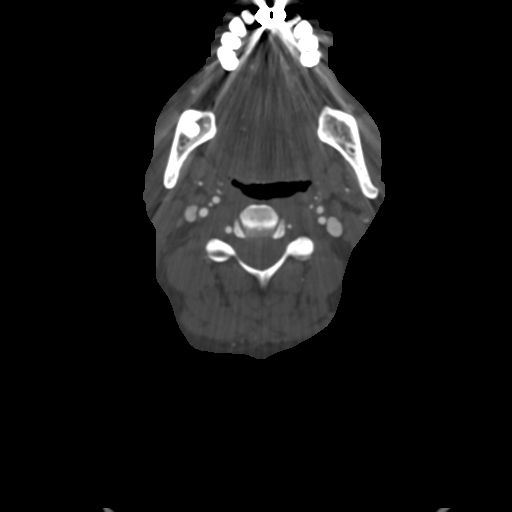
[im 190/228  soft-tissue]
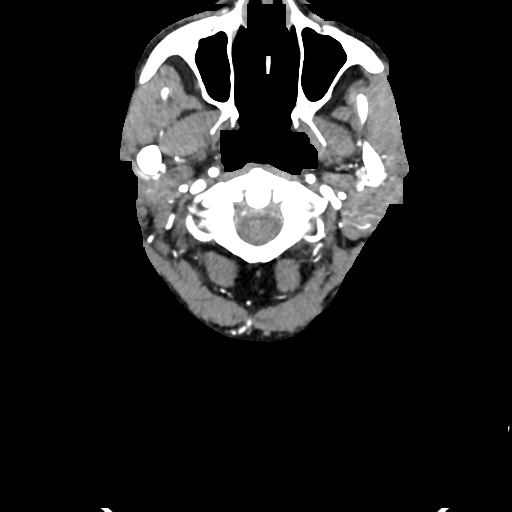

[Series 10: sag thin mips cta neck 1.00 sag · sagittal · 0.45mm/px · 1 of 229 slices shown]
[im 115/229  soft-tissue]
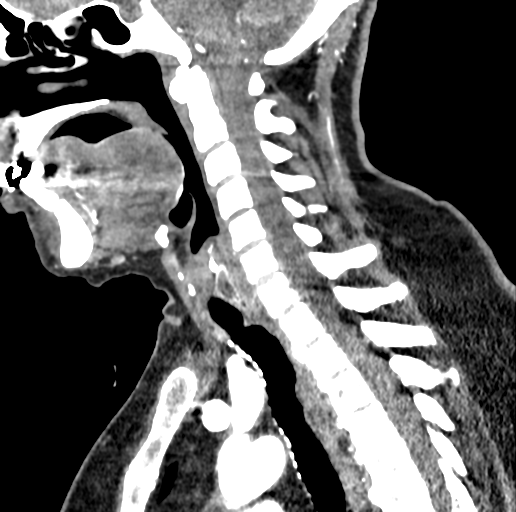

[8 of 33 positions shown; findings below may reference images not displayed]

FINDINGS: Aortic arch: Aortic atherosclerosis. Branching pattern is normal
without origin stenosis.

Right carotid system: Common carotid artery is widely patent to the
bifurcation region. There is extensive soft and calcified plaque at
the carotid bifurcation and ICA bulb. Minimal diameter in the
proximal ICA bulb is 1.7 mm. Compared to a more distal cervical ICA
diameter of 4 mm, this indicates a 60% stenosis. 50% ECA stenosis.

Left carotid system: Common carotid artery widely patent to the
bifurcation. Extensive soft plaque at the carotid bifurcation and
ICA bulb region. Minimal diameter of the ICA is also 1.7 mm on this
side. Compared to a more distal cervical ICA diameter of 4 mm, this
indicates a 60% stenosis. 70% ECA stenosis.

Vertebral arteries: Both vertebral artery origins are widely patent.
No stenosis greater than 30%. Beyond that, both vertebral arteries
appear normal through the cervical regions, through the foramen
magnum. Left vertebral artery terminates in PICA. Right vertebral
artery supplies the basilar.

Skeleton: Normal

Other neck: No mass or lymphadenopathy.  Normal

Upper chest: Normal
IMPRESSION: Aortic atherosclerosis.

Complex atherosclerotic disease at both carotid bifurcation regions.
Minimal diameter of the internal carotid artery on both sides is
mm, indicating a 60% stenosis when compared to the more distal
cervical ICA diameter of 4 mm on each side. Some irregularity could
increase relative risk for embolic disease.

## 2021-02-22 ENCOUNTER — Other Ambulatory Visit: Payer: Self-pay | Admitting: Physician Assistant

## 2021-02-22 DIAGNOSIS — Z13820 Encounter for screening for osteoporosis: Secondary | ICD-10-CM

## 2021-03-01 ENCOUNTER — Other Ambulatory Visit: Payer: Self-pay | Admitting: *Deleted

## 2021-03-01 MED ORDER — METFORMIN HCL 500 MG PO TABS
500.0000 mg | ORAL_TABLET | Freq: Two times a day (BID) | ORAL | 1 refills | Status: DC
Start: 2021-03-01 — End: 2021-07-18

## 2021-04-21 ENCOUNTER — Ambulatory Visit (INDEPENDENT_AMBULATORY_CARE_PROVIDER_SITE_OTHER): Payer: Medicare Other

## 2021-04-21 DIAGNOSIS — E119 Type 2 diabetes mellitus without complications: Secondary | ICD-10-CM

## 2021-04-21 DIAGNOSIS — Z0000 Encounter for general adult medical examination without abnormal findings: Secondary | ICD-10-CM

## 2021-04-21 MED ORDER — GLUCOSE BLOOD VI STRP: ORAL_STRIP | 12 refills | Status: AC

## 2021-04-21 NOTE — Progress Notes (Signed)
Subjective:   Sheena Parker is a 70 y.o. female who presents for Medicare Annual (Subsequent) preventive examination. I discussed the limitations of evaluation and management by telemedicine and the availability of in person appointments. The patient expressed understanding and agreed to proceed.   Visit performed by audio   Patient location: Home  Provider location: Home  Review of Systems    N/A       Objective:    There were no vitals filed for this visit. There is no height or weight on file to calculate BMI.  Advanced Directives 04/21/2021 01/06/2020 12/16/2019 10/30/2019 10/30/2019 03/17/2019 11/03/2016  Does Patient Have a Medical Advance Directive? No No No Yes Yes No No  Type of Advance Directive - - Programmer, multimedia of [place];Living will - -  Does patient want to make changes to medical advance directive? - - - No - Patient declined - - -  Copy of [place] in Chart? - - - - No - copy requested - -  Would patient like information on creating a medical advance directive? - No - Patient declined No - Patient declined - No - Patient declined - -    Current Medications (verified) Outpatient Encounter Medications as of 04/21/2021  Medication Sig   acetaminophen (TYLENOL) 325 MG tablet Take 650 mg by mouth every 6 (six) hours as needed for mild pain or headache.    alendronate (FOSAMAX) 70 MG tablet Take 1 tablet (70 mg total) by mouth every 7 (seven) days. Take with a full glass of water on an empty stomach.   amLODipine (NORVASC) 5 MG tablet Take 1 tablet (5 mg total) by mouth daily.   aspirin EC 81 MG tablet Take 81 mg by mouth daily.   atorvastatin (LIPITOR) 20 MG tablet Take 1 tablet (20 mg total) by mouth at bedtime.   Cholecalciferol (VITAMIN D) 125 MCG (5000 UT) CAPS 1  Capsule daily   clopidogrel (PLAVIX) 75 MG tablet Take 1 tablet (75 mg total) by mouth daily.   gabapentin (NEURONTIN) 100 MG capsule Take 1 capsule  (100 mg total) by mouth 3 (three) times daily.   levothyroxine (SYNTHROID) 50 MCG tablet Take 1 tablet (50 mcg total) by mouth daily before breakfast.   losartan (COZAAR) 50 MG tablet Take 1 tablet (50 mg total) by mouth 2 (two) times daily.   metFORMIN (GLUCOPHAGE) 500 MG tablet Take 1 tablet (500 mg total) by mouth 2 (two) times daily.   [DISCONTINUED] glucose blood test strip DX E11.9  Check blood sugar daily   glucose blood test strip DX E11.9  Check blood sugar daily   No facility-administered encounter medications on file as of 04/21/2021.    Allergies (verified) Patient has no known allergies.   History: Past Medical History:  Diagnosis Date   Anemia 02/16/2020   Arthritis    KNEE, hands   Diabetes mellitus without complication ([place])    Difficult intubation    Gall stones 10/31/2016   GERD (gastroesophageal reflux disease)    NO MEDS   Hyperlipidemia    Hypertension    Hypothyroidism    Presence of dental prosthetic device    Dental implants,  top - front 2 teeth   Thyroid disease    Weight decrease 02/16/2020   Weight loss 02/16/2020   Past Surgical History:  Procedure Laterality Date   CAROTID PTA/STENT INTERVENTION Left 10/30/2019   Procedure: CAROTID PTA/STENT INTERVENTION;  Surgeon: Algernon Huxley, MD;  Location: [facility];  Service: Cardiovascular;  Laterality: Left;   CATARACT EXTRACTION W/PHACO Right 12/16/2019   Procedure: CATARACT EXTRACTION PHACO AND INTRAOCULAR LENS PLACEMENT (IOC) RIGHT DIABETIC 7.57 00:55.0;  Surgeon: Birder Robson, MD;  Location: [facility];  Service: Ophthalmology;  Laterality: Right;  Diabetic - oral meds   CATARACT EXTRACTION W/PHACO Left 01/06/2020   Procedure: CATARACT EXTRACTION PHACO AND INTRAOCULAR LENS PLACEMENT (IOC) LEFT DIABETIC 7.12  00:44.5;  Surgeon: Birder Robson, MD;  Location: [facility];  Service: Ophthalmology;  Laterality: Left;   CHOLECYSTECTOMY N/A 11/14/2016   Procedure:  LAPAROSCOPIC CHOLECYSTECTOMY WITH INTRAOPERATIVE CHOLANGIOGRAM;  Surgeon: Robert Bellow, MD;  Location: [facility];  Service: General;  Laterality: N/A;   COLONOSCOPY WITH PROPOFOL N/A 03/17/2019   Procedure: COLONOSCOPY WITH PROPOFOL;  Surgeon: Virgel Manifold, MD;  Location: [facility];  Service: Endoscopy;  Laterality: N/A;   Family History  Problem Relation Age of Onset   Breast cancer Mother 76   Breast cancer Sister 41   Breast cancer Sister 34   Breast cancer Sister 74   Colon cancer Neg Hx    Social History   Socioeconomic History   Marital status: Married    Spouse name: Not on file   Number of children: Not on file   Years of education: Not on file   Highest education level: Not on file  Occupational History   Not on file  Tobacco Use   Smoking status: Never   Smokeless tobacco: Never  Vaping Use   Vaping Use: Never used  Substance and Sexual Activity   Alcohol use: No   Drug use: No   Sexual activity: Not Currently  Other Topics Concern   Not on file  Social History Narrative   Not on file   Social Determinants of Health   Financial Resource Strain: Low Risk    Difficulty of Paying Living Expenses: Not hard at all  Food Insecurity: No Food Insecurity   Worried About Charity fundraiser in the Last Year: Never true   [place] in the Last Year: Never true  Transportation Needs: No Transportation Needs   Lack of Transportation (Medical): No   Lack of Transportation (Non-Medical): No  Physical Activity: Insufficiently Active   Days of Exercise per Week: 4 days   Minutes of Exercise per Session: 20 min  Stress: No Stress Concern Present   Feeling of Stress : Not at all  Social Connections: Socially Integrated   Frequency of Communication with Friends and Family: More than three times a week   Frequency of Social Gatherings with Friends and Family: Twice a week   Attends Religious Services: More than 4 times per year   Active Member of  [employer] or Organizations: Yes   Attends Music therapist: More than 4 times per year   Marital Status: Married    Tobacco Counseling Counseling given: Not Answered   Clinical Intake:  Pre-visit preparation completed: Yes  Pain : No/denies pain     Diabetes: Yes  How often do you need to have someone help you when you read instructions, pamphlets, or other written materials from your doctor or pharmacy?: 1 - Never  Diabetic? Yes  Interpreter Needed?: No  Information entered by :: Anson [place] CMA   Activities of Daily Living In your present state of health, do you have any difficulty performing the following activities: 04/21/2021  Hearing? N  Vision? N  Difficulty concentrating or making decisions?  N  Walking or climbing stairs? N  Dressing or bathing? N  Doing errands, shopping? N  Preparing Food and eating ? N  Using the Toilet? N  In the past six months, have you accidently leaked urine? N  Do you have problems with loss of bowel control? N  Managing your Medications? N  Managing your Finances? N  Housekeeping or managing your Housekeeping? N  Some recent data might be hidden    Patient Care Team: Cletis Athens, MD as PCP - General (Internal Medicine)  Indicate any recent Medical Services you may have received from other than [facility] providers in the past year (date may be approximate).     Assessment:   This is a routine wellness examination for [place].  Hearing/Vision screen No results found.  Dietary issues and exercise activities discussed: Current Exercise Habits: Home exercise routine   Goals Addressed   None    Depression Screen PHQ 2/9 Scores 04/21/2021 01/26/2021  PHQ - 2 Score 0 0    Fall Risk Fall Risk  04/21/2021  Falls in the past year? 0  Number falls in past yr: 0  Injury with Fall? 0  Risk for fall due to : No Fall Risks  Follow up Falls evaluation completed    [place]:  Any  stairs in or around the home? Yes  If so, are there any without handrails? No  Home free of loose throw rugs in walkways, pet beds, electrical cords, etc? Yes  Adequate lighting in your home to reduce risk of falls? Yes   ASSISTIVE DEVICES UTILIZED TO PREVENT FALLS:  Life alert? No  Use of a cane, walker or w/c? No  Grab bars in the bathroom? No  Shower chair or bench in shower? Yes  Elevated toilet seat or a handicapped toilet? No   TIMED UP AND GO:  Was the test performed? No .  Length of time to ambulate 10 feet: 0 sec.     Cognitive Function:     6CIT Screen 04/21/2021  What Year? 0 points  What month? 0 points  What time? 0 points  Count back from 20 0 points  Months in reverse 0 points  Repeat phrase 0 points  Total Score 0    Immunizations Immunization History  Administered Date(s) Administered   PFIZER(Purple Top)SARS-COV-2 Vaccination 06/08/2019, 06/29/2019, 02/16/2020   Pneumococcal Polysaccharide-23 12/23/2014    TDAP status: Due, Education has been provided regarding the importance of this vaccine. Advised may receive this vaccine at local pharmacy or Health Dept. Aware to provide a copy of the vaccination record if obtained from local pharmacy or Health Dept. Verbalized acceptance and understanding.  Flu Vaccine status:  Pneumococcal vaccine status: Due, Education has been provided regarding the importance of this vaccine. Advised may receive this vaccine at local pharmacy or Health Dept. Aware to provide a copy of the vaccination record if obtained from local pharmacy or Health Dept. Verbalized acceptance and understanding.  Covid-19 vaccine status: Information provided on how to obtain vaccines.   Qualifies for Shingles Vaccine? Yes   Zostavax completed Yes   Shingrix Completed?: No.    Education has been provided regarding the importance of this vaccine. Patient has been advised to call insurance company to determine out of pocket expense if they have  not yet received this vaccine. Advised may also receive vaccine at local pharmacy or Health Dept. Verbalized acceptance and understanding.  Screening Tests Health Maintenance  Topic Date Due  FOOT EXAM  Never done   Hepatitis C Screening  Never done   TETANUS/TDAP  Never done   Zoster Vaccines- Shingrix (1 of 2) Never done   Pneumonia Vaccine 17+ Years old (2 - PCV) 12/23/2015   DEXA SCAN  Never done   MAMMOGRAM  08/16/2019   COVID-19 Vaccine (4 - Booster for Pfizer series) 04/12/2020   OPHTHALMOLOGY EXAM  12/02/2020   INFLUENZA VACCINE  12/06/2020   HEMOGLOBIN A1C  03/20/2021   COLONOSCOPY (Pts 45-21yrs Insurance coverage will need to be confirmed)  03/16/2024   HPV VACCINES  Aged Out    Health Maintenance  Health Maintenance Due  Topic Date Due   FOOT EXAM  Never done   Hepatitis C Screening  Never done   TETANUS/TDAP  Never done   Zoster Vaccines- Shingrix (1 of 2) Never done   Pneumonia Vaccine 36+ Years old (2 - PCV) 12/23/2015   DEXA SCAN  Never done   MAMMOGRAM  08/16/2019   COVID-19 Vaccine (4 - Booster for Pfizer series) 04/12/2020   OPHTHALMOLOGY EXAM  12/02/2020   INFLUENZA VACCINE  12/06/2020   HEMOGLOBIN A1C  03/20/2021    Colorectal cancer screening: Type of screening: Colonoscopy. Completed 2020. Repeat every 10 years  Mammogram status: Ordered 01/2021. Pt provided with contact info and advised to call to schedule appt.     Lung Cancer Screening: (Low Dose CT Chest recommended if Age 52-80 years, 30 pack-year currently smoking OR have quit w/in 15years.) does not qualify.   Lung Cancer Screening Referral: No  Additional Screening:  Hepatitis C Screening: does qualify; Completed No  Vision Screening: Recommended annual ophthalmology exams for early detection of glaucoma and other disorders of the eye. Is the patient up to date with their annual eye exam?  No Who is the provider or what is the name of the office in which the patient attends annual  eye exams? [facility] If pt is not established with a provider, would they like to be referred to a provider to establish care? No .   Dental Screening: Recommended annual dental exams for proper oral hygiene  Community Resource Referral / Chronic Care Management: CRR required this visit?  No   CCM required this visit?  No      Plan:     I have personally reviewed and noted the following in the patients chart:   Medical and social history Use of alcohol, tobacco or illicit drugs  Current medications and supplements including opioid prescriptions.  Functional ability and status Nutritional status Physical activity Advanced directives List of other physicians Hospitalizations, surgeries, and ER visits in previous 12 months Vitals Screenings to include cognitive, depression, and falls Referrals and appointments  In addition, I have reviewed and discussed with patient certain preventive protocols, quality metrics, and best practice recommendations. A written personalized care plan for preventive services as well as general preventive health recommendations were provided to patient.     Renato Gails, [place]   05/03/2021   Nurse Notes: Patient informed of vaccines that are due, Mrs. Kosek was informed on how to obtain vaccines. Mammogram has been ordered in Sept. Pt will call to schedule. Diabetes test strips were sent in today.

## 2021-05-04 NOTE — Progress Notes (Signed)
I have reviewed this visit and agree with the documentation.   

## 2021-07-17 ENCOUNTER — Other Ambulatory Visit: Payer: Self-pay | Admitting: Internal Medicine

## 2021-08-01 ENCOUNTER — Other Ambulatory Visit: Payer: Self-pay | Admitting: *Deleted

## 2021-08-01 MED ORDER — ATORVASTATIN CALCIUM 20 MG PO TABS: 20.0000 mg | ORAL_TABLET | Freq: Every day | ORAL | 3 refills | Status: DC

## 2021-08-01 MED ORDER — AMLODIPINE BESYLATE 5 MG PO TABS: 5.0000 mg | ORAL_TABLET | Freq: Every day | ORAL | 3 refills | Status: DC

## 2021-08-01 MED ORDER — METFORMIN HCL 500 MG PO TABS: 500.0000 mg | ORAL_TABLET | Freq: Two times a day (BID) | ORAL | 3 refills | Status: DC

## 2021-08-01 MED ORDER — ALENDRONATE SODIUM 70 MG PO TABS: 70.0000 mg | ORAL_TABLET | ORAL | 3 refills | Status: DC

## 2021-08-01 MED ORDER — LEVOTHYROXINE SODIUM 50 MCG PO TABS: 50.0000 ug | ORAL_TABLET | Freq: Every day | ORAL | 3 refills | Status: DC

## 2021-08-09 ENCOUNTER — Other Ambulatory Visit: Payer: Self-pay | Admitting: *Deleted

## 2021-08-09 MED ORDER — METFORMIN HCL 500 MG PO TABS: 500.0000 mg | ORAL_TABLET | Freq: Two times a day (BID) | ORAL | 3 refills | Status: DC

## 2021-08-09 MED ORDER — LEVOTHYROXINE SODIUM 50 MCG PO TABS: 50.0000 ug | ORAL_TABLET | Freq: Every day | ORAL | 3 refills | Status: DC

## 2021-12-06 ENCOUNTER — Other Ambulatory Visit: Payer: Self-pay | Admitting: *Deleted

## 2021-12-06 MED ORDER — LEVOTHYROXINE SODIUM 50 MCG PO TABS: 50.0000 ug | ORAL_TABLET | Freq: Every day | ORAL | 3 refills | Status: AC

## 2021-12-06 MED ORDER — ALENDRONATE SODIUM 70 MG PO TABS: 70.0000 mg | ORAL_TABLET | ORAL | 3 refills | Status: DC

## 2021-12-06 MED ORDER — GABAPENTIN 100 MG PO CAPS: 100.0000 mg | ORAL_CAPSULE | Freq: Three times a day (TID) | ORAL | 3 refills | Status: AC

## 2021-12-06 MED ORDER — AMLODIPINE BESYLATE 5 MG PO TABS: 5.0000 mg | ORAL_TABLET | Freq: Every day | ORAL | 3 refills | Status: AC

## 2021-12-06 MED ORDER — METFORMIN HCL 500 MG PO TABS: 500.0000 mg | ORAL_TABLET | Freq: Two times a day (BID) | ORAL | 3 refills | Status: DC

## 2021-12-06 MED ORDER — ATORVASTATIN CALCIUM 20 MG PO TABS: 20.0000 mg | ORAL_TABLET | Freq: Every day | ORAL | 3 refills | Status: DC

## 2021-12-06 MED ORDER — LOSARTAN POTASSIUM 50 MG PO TABS: 50.0000 mg | ORAL_TABLET | Freq: Two times a day (BID) | ORAL | 1 refills | Status: AC

## 2022-03-06 ENCOUNTER — Encounter (INDEPENDENT_AMBULATORY_CARE_PROVIDER_SITE_OTHER): Payer: Self-pay

## 2022-04-21 LAB — HM DIABETES EYE EXAM

## 2022-04-25 ENCOUNTER — Encounter: Payer: Self-pay | Admitting: Internal Medicine

## 2022-04-26 DIAGNOSIS — Z0100 Encounter for examination of eyes and vision without abnormal findings: Secondary | ICD-10-CM | POA: Diagnosis not present

## 2022-05-17 ENCOUNTER — Other Ambulatory Visit: Payer: Self-pay | Admitting: Internal Medicine

## 2023-03-12 DIAGNOSIS — E119 Type 2 diabetes mellitus without complications: Secondary | ICD-10-CM | POA: Diagnosis not present

## 2023-03-12 DIAGNOSIS — I6529 Occlusion and stenosis of unspecified carotid artery: Secondary | ICD-10-CM | POA: Diagnosis not present

## 2023-03-12 DIAGNOSIS — I6522 Occlusion and stenosis of left carotid artery: Secondary | ICD-10-CM | POA: Diagnosis not present

## 2023-03-12 DIAGNOSIS — G629 Polyneuropathy, unspecified: Secondary | ICD-10-CM | POA: Diagnosis not present

## 2023-03-12 DIAGNOSIS — I10 Essential (primary) hypertension: Secondary | ICD-10-CM | POA: Diagnosis not present

## 2023-03-23 ENCOUNTER — Other Ambulatory Visit: Payer: Self-pay | Admitting: Internal Medicine

## 2023-03-23 DIAGNOSIS — Z1231 Encounter for screening mammogram for malignant neoplasm of breast: Secondary | ICD-10-CM

## 2023-04-17 ENCOUNTER — Ambulatory Visit
Admission: RE | Admit: 2023-04-17 | Discharge: 2023-04-17 | Disposition: A | Discharge status: Home / Self Care | Payer: Medicare HMO | Source: Ambulatory Visit | Attending: Internal Medicine | Admitting: Internal Medicine

## 2023-04-17 DIAGNOSIS — Z1231 Encounter for screening mammogram for malignant neoplasm of breast: Secondary | ICD-10-CM | POA: Diagnosis not present

## 2023-06-22 ENCOUNTER — Other Ambulatory Visit (INDEPENDENT_AMBULATORY_CARE_PROVIDER_SITE_OTHER): Payer: Self-pay | Admitting: Nurse Practitioner

## 2023-06-22 DIAGNOSIS — Z8679 Personal history of other diseases of the circulatory system: Secondary | ICD-10-CM

## 2023-06-25 ENCOUNTER — Ambulatory Visit (INDEPENDENT_AMBULATORY_CARE_PROVIDER_SITE_OTHER): Payer: Medicare HMO | Admitting: Nurse Practitioner

## 2023-06-25 ENCOUNTER — Encounter (INDEPENDENT_AMBULATORY_CARE_PROVIDER_SITE_OTHER): Payer: Self-pay | Admitting: Nurse Practitioner

## 2023-06-25 ENCOUNTER — Ambulatory Visit (INDEPENDENT_AMBULATORY_CARE_PROVIDER_SITE_OTHER): Payer: Medicare HMO

## 2023-06-25 VITALS — BP 181/70 | HR 52 | Resp 18 | Ht 60.0 in | Wt 144.8 lb

## 2023-06-25 DIAGNOSIS — I10 Essential (primary) hypertension: Secondary | ICD-10-CM | POA: Diagnosis not present

## 2023-06-25 DIAGNOSIS — Z8679 Personal history of other diseases of the circulatory system: Secondary | ICD-10-CM | POA: Diagnosis not present

## 2023-06-25 DIAGNOSIS — I6523 Occlusion and stenosis of bilateral carotid arteries: Secondary | ICD-10-CM | POA: Diagnosis not present

## 2023-06-25 DIAGNOSIS — E119 Type 2 diabetes mellitus without complications: Secondary | ICD-10-CM

## 2023-06-26 NOTE — Progress Notes (Signed)
Subjective:    Patient ID: Sheena Parker, female    DOB: [birth year], 73 y.o.   MRN: [id number] Chief Complaint  Patient presents with   Follow-up    NP. LS JD 2021. carotid/consult. h/o left carotid stent. [place]    The patient is seen for follow up evaluation of carotid stenosis. The carotid stenosis followed by ultrasound.  The patient has a previous left ICA stent.  Her last studies were done on 03/02/2020.  The patient denies amaurosis fugax. There is no recent history of TIA symptoms or focal motor deficits. There is no prior documented CVA.  The patient is taking enteric-coated aspirin 81 mg daily.  There is no history of migraine headaches. There is no history of seizures.  No recent shortening of the patient's walking distance or new symptoms consistent with claudication.  No history of rest pain symptoms. No new ulcers or wounds of the lower extremities have occurred.  There is no history of DVT, PE or superficial thrombophlebitis. No documented recent episodes of angina or shortness of breath documented.   Carotid Duplex done today shows 60 to 79% stenosis of the right ICA.  The velocities within the mid ICA are notably increased from the previous velocities done in 2021.  The left ICA is 40 to 49% with velocities that are fairly consistent with previous studies.  Stent appears widely patent.  Antegrade flow in the bilateral vertebrals with bilateral subclavian arteries showing antegrade flow as well.    Review of Systems  All other systems reviewed and are negative.      Objective:   Physical Exam Vitals reviewed.  HENT:     Head: Normocephalic.  Neck:     Vascular: Carotid bruit present.  Cardiovascular:     Rate and Rhythm: Normal rate.     Pulses:          Radial pulses are 2+ on the right side and 2+ on the left side.  Pulmonary:     Effort: Pulmonary effort is normal.  Skin:    General: Skin is warm and dry.  Neurological:     Mental Status: She is  alert and oriented to person, place, and time.  Psychiatric:        Mood and Affect: Mood normal.        Behavior: Behavior normal.        Thought Content: Thought content normal.        Judgment: Judgment normal.     BP (!) 181/70   Pulse (!) 52   Resp 18   Ht 5' (1.524 m)   Wt 144 lb 12.8 oz (65.7 kg)   BMI 28.28 kg/m   Past Medical History:  Diagnosis Date   Anemia 02/16/2020   Arthritis    KNEE, hands   Diabetes mellitus without complication (HCC)    Difficult intubation    Gall stones 10/31/2016   GERD (gastroesophageal reflux disease)    NO MEDS   Hyperlipidemia    Hypertension    Hypothyroidism    Presence of dental prosthetic device    Dental implants,  top - front 2 teeth   Thyroid disease    Weight decrease 02/16/2020   Weight loss 02/16/2020    Social History   Socioeconomic History   Marital status: Married    Spouse name: Not on file   Number of children: Not on file   Years of education: Not on file   Highest education level: Not on file  Occupational History   Not on file  Tobacco Use   Smoking status: Never   Smokeless tobacco: Never  Vaping Use   Vaping status: Never Used  Substance and Sexual Activity   Alcohol use: No   Drug use: No   Sexual activity: Not Currently  Other Topics Concern   Not on file  Social History Narrative   Not on file   Social Drivers of Health   Financial Resource Strain: Low Risk  (05/03/2021)   Overall Financial Resource Strain (CARDIA)    Difficulty of Paying Living Expenses: Not hard at all  Food Insecurity: No Food Insecurity (04/21/2021)   Hunger Vital Sign    Worried About Running Out of Food in the Last Year: Never true    Ran Out of Food in the Last Year: Never true  Transportation Needs: No Transportation Needs (04/21/2021)   PRAPARE - Administrator, Civil Service (Medical): No    Lack of Transportation (Non-Medical): No  Physical Activity: Insufficiently Active (04/21/2021)    Exercise Vital Sign    Days of Exercise per Week: 4 days    Minutes of Exercise per Session: 20 min  Stress: No Stress Concern Present (04/21/2021)   [employer] of Occupational Health - Occupational Stress Questionnaire    Feeling of Stress : Not at all  Social Connections: Socially Integrated (04/21/2021)   Social Connection and Isolation Panel [NHANES]    Frequency of Communication with Friends and Family: More than three times a week    Frequency of Social Gatherings with Friends and Family: Twice a week    Attends Religious Services: More than 4 times per year    Active Member of [employer] or Organizations: Yes    Attends Banker Meetings: More than 4 times per year    Marital Status: Married  Catering manager Violence: Not At Risk (05/03/2021)   Humiliation, Afraid, Rape, and Kick questionnaire    Fear of Current or Ex-Partner: No    Emotionally Abused: No    Physically Abused: No    Sexually Abused: No    Past Surgical History:  Procedure Laterality Date   CAROTID PTA/STENT INTERVENTION Left 10/30/2019   Procedure: CAROTID PTA/STENT INTERVENTION;  Surgeon: Annice Needy, MD;  Location: [facility];  Service: Cardiovascular;  Laterality: Left;   CATARACT EXTRACTION W/PHACO Right 12/16/2019   Procedure: CATARACT EXTRACTION PHACO AND INTRAOCULAR LENS PLACEMENT (IOC) RIGHT DIABETIC 7.57 00:55.0;  Surgeon: Galen Manila, MD;  Location: [facility];  Service: Ophthalmology;  Laterality: Right;  Diabetic - oral meds   CATARACT EXTRACTION W/PHACO Left 01/06/2020   Procedure: CATARACT EXTRACTION PHACO AND INTRAOCULAR LENS PLACEMENT (IOC) LEFT DIABETIC 7.12  00:44.5;  Surgeon: Galen Manila, MD;  Location: [facility];  Service: Ophthalmology;  Laterality: Left;   CHOLECYSTECTOMY N/A 11/14/2016   Procedure: LAPAROSCOPIC CHOLECYSTECTOMY WITH INTRAOPERATIVE CHOLANGIOGRAM;  Surgeon: Earline Mayotte, MD;  Location: [facility];  Service: General;   Laterality: N/A;   COLONOSCOPY WITH PROPOFOL N/A 03/17/2019   Procedure: COLONOSCOPY WITH PROPOFOL;  Surgeon: Pasty Spillers, MD;  Location: [facility];  Service: Endoscopy;  Laterality: N/A;    Family History  Problem Relation Age of Onset   Breast cancer Mother 53   Breast cancer Sister 8   Breast cancer Sister 26   Breast cancer Sister 72   Colon cancer Neg Hx     No Known Allergies     Latest Ref Rng & Units 09/17/2020  9:18 AM 02/16/2020    3:54 PM 10/31/2019    5:43 AM  CBC  WBC 3.8 - 10.8 Thousand/uL 7.1  8.0  7.4   Hemoglobin 11.7 - 15.5 g/dL 04.5  40.9  81.1   Hematocrit 35.0 - 45.0 % 36.1  36.5  30.5   Platelets 140 - 400 Thousand/uL 289  278  208       CMP     Component Value Date/Time   NA 133 (L) 02/04/2021 0924   K 4.3 01/26/2021 1543   CL 88 (L) 01/26/2021 1543   CO2 26 01/26/2021 1543   GLUCOSE 136 (H) 02/04/2021 0924   GLUCOSE 99 01/26/2021 1543   BUN 16 02/04/2021 0924   CREATININE 0.70 02/04/2021 0924   CREATININE 0.63 01/26/2021 1543   CALCIUM 9.5 01/26/2021 1543   PROT 7.2 01/26/2021 1543   AST 21 01/26/2021 1543   ALT 19 01/26/2021 1543   BILITOT 0.6 01/26/2021 1543   EGFR 94 02/04/2021 0924   GFRNONAA 91 09/17/2020 0918     No results found.     Assessment & Plan:   1. Bilateral carotid artery stenosis (Primary) Today the patient studies revealed patency of her left ICA stent.  Her right ICA also remains patent however velocities within the mid ICA indicate that her stenosis is on the higher end at the 60 to 79% bracket.  Based on this I have recommended that she undergo a CT angiogram in order to evaluate the level of stenosis more closely in addition to plan for possible intervention.  At this time patient does not wish to move forward with CT scan but wishes to discuss with their PCP as well as a family member who is a physician as well.  They plan on getting a second opinion and following these discussions they will  contact us if they wish to move forward with a CT scan.  2. Type 2 diabetes mellitus without complication, without long-term current use of insulin (HCC) Continue hypoglycemic medications as already ordered, these medications have been reviewed and there are no changes at this time.  Hgb A1C to be monitored as already arranged by primary service  3. Essential hypertension Continue antihypertensive medications as already ordered, these medications have been reviewed and there are no changes at this time.  Elevated BP today but will follow-up with PCP soon   Current Outpatient Medications on File Prior to Visit  Medication Sig Dispense Refill   acetaminophen (TYLENOL) 325 MG tablet Take 650 mg by mouth every 6 (six) hours as needed for mild pain or headache.      alendronate (FOSAMAX) 70 MG tablet Take 1 tablet (70 mg total) by mouth every 7 (seven) days. Take with a full glass of water on an empty stomach. 12 tablet 3   amLODipine (NORVASC) 5 MG tablet Take 1 tablet (5 mg total) by mouth daily. 90 tablet 3   aspirin EC 81 MG tablet Take 81 mg by mouth daily.     atorvastatin (LIPITOR) 20 MG tablet Take 1 tablet (20 mg total) by mouth at bedtime. 90 tablet 3   Cholecalciferol (VITAMIN D) 125 MCG (5000 UT) CAPS 1  Capsule daily 30 capsule 11   clopidogrel (PLAVIX) 75 MG tablet Take 1 tablet (75 mg total) by mouth daily. 30 tablet 6   gabapentin (NEURONTIN) 100 MG capsule Take 1 capsule (100 mg total) by mouth 3 (three) times daily. 90 capsule 3   glucose blood test strip DX E11.9  Check blood sugar daily 100 each 12   levothyroxine (SYNTHROID) 50 MCG tablet Take 1 tablet (50 mcg total) by mouth daily before breakfast. 90 tablet 3   losartan (COZAAR) 50 MG tablet Take 1 tablet (50 mg total) by mouth 2 (two) times daily. 180 tablet 1   metFORMIN (GLUCOPHAGE) 500 MG tablet Take 1 tablet (500 mg total) by mouth 2 (two) times daily. 180 tablet 3   No current facility-administered medications on  file prior to visit.    There are no Patient Instructions on file for this visit. No follow-ups on file.   Georgiana Spinner, NP

## 2023-09-05 DIAGNOSIS — H524 Presbyopia: Secondary | ICD-10-CM | POA: Diagnosis not present

## 2023-09-05 DIAGNOSIS — E119 Type 2 diabetes mellitus without complications: Secondary | ICD-10-CM | POA: Diagnosis not present

## 2023-09-05 DIAGNOSIS — H26493 Other secondary cataract, bilateral: Secondary | ICD-10-CM | POA: Diagnosis not present

## 2023-09-05 DIAGNOSIS — Z961 Presence of intraocular lens: Secondary | ICD-10-CM | POA: Diagnosis not present

## 2023-09-08 ENCOUNTER — Emergency Department

## 2023-09-08 ENCOUNTER — Other Ambulatory Visit: Payer: Self-pay

## 2023-09-08 ENCOUNTER — Inpatient Hospital Stay
Admission: EM | Admit: 2023-09-08 | Discharge: 2023-09-10 | DRG: 322 | Disposition: A | Discharge status: Home / Self Care | Attending: Student | Admitting: Student

## 2023-09-08 DIAGNOSIS — I10 Essential (primary) hypertension: Secondary | ICD-10-CM | POA: Diagnosis present

## 2023-09-08 DIAGNOSIS — E1165 Type 2 diabetes mellitus with hyperglycemia: Secondary | ICD-10-CM | POA: Diagnosis present

## 2023-09-08 DIAGNOSIS — E1142 Type 2 diabetes mellitus with diabetic polyneuropathy: Secondary | ICD-10-CM | POA: Diagnosis present

## 2023-09-08 DIAGNOSIS — E039 Hypothyroidism, unspecified: Secondary | ICD-10-CM | POA: Diagnosis present

## 2023-09-08 DIAGNOSIS — Z6827 Body mass index (BMI) 27.0-27.9, adult: Secondary | ICD-10-CM

## 2023-09-08 DIAGNOSIS — I161 Hypertensive emergency: Secondary | ICD-10-CM | POA: Diagnosis not present

## 2023-09-08 DIAGNOSIS — K219 Gastro-esophageal reflux disease without esophagitis: Secondary | ICD-10-CM | POA: Diagnosis present

## 2023-09-08 DIAGNOSIS — E785 Hyperlipidemia, unspecified: Secondary | ICD-10-CM | POA: Diagnosis present

## 2023-09-08 DIAGNOSIS — Z7982 Long term (current) use of aspirin: Secondary | ICD-10-CM | POA: Diagnosis not present

## 2023-09-08 DIAGNOSIS — R079 Chest pain, unspecified: Secondary | ICD-10-CM | POA: Diagnosis not present

## 2023-09-08 DIAGNOSIS — Z9861 Coronary angioplasty status: Secondary | ICD-10-CM | POA: Diagnosis not present

## 2023-09-08 DIAGNOSIS — E1151 Type 2 diabetes mellitus with diabetic peripheral angiopathy without gangrene: Secondary | ICD-10-CM | POA: Diagnosis present

## 2023-09-08 DIAGNOSIS — R42 Dizziness and giddiness: Secondary | ICD-10-CM | POA: Diagnosis not present

## 2023-09-08 DIAGNOSIS — Z803 Family history of malignant neoplasm of breast: Secondary | ICD-10-CM

## 2023-09-08 DIAGNOSIS — R55 Syncope and collapse: Secondary | ICD-10-CM | POA: Diagnosis not present

## 2023-09-08 DIAGNOSIS — I214 Non-ST elevation (NSTEMI) myocardial infarction: Principal | ICD-10-CM | POA: Diagnosis present

## 2023-09-08 DIAGNOSIS — I2510 Atherosclerotic heart disease of native coronary artery without angina pectoris: Secondary | ICD-10-CM | POA: Diagnosis present

## 2023-09-08 DIAGNOSIS — Z9049 Acquired absence of other specified parts of digestive tract: Secondary | ICD-10-CM

## 2023-09-08 DIAGNOSIS — E669 Obesity, unspecified: Secondary | ICD-10-CM | POA: Diagnosis present

## 2023-09-08 DIAGNOSIS — Z7984 Long term (current) use of oral hypoglycemic drugs: Secondary | ICD-10-CM | POA: Diagnosis not present

## 2023-09-08 DIAGNOSIS — I2119 ST elevation (STEMI) myocardial infarction involving other coronary artery of inferior wall: Secondary | ICD-10-CM | POA: Diagnosis not present

## 2023-09-08 DIAGNOSIS — Z79899 Other long term (current) drug therapy: Secondary | ICD-10-CM | POA: Diagnosis not present

## 2023-09-08 DIAGNOSIS — Z95828 Presence of other vascular implants and grafts: Secondary | ICD-10-CM

## 2023-09-08 DIAGNOSIS — R0789 Other chest pain: Secondary | ICD-10-CM | POA: Diagnosis not present

## 2023-09-08 DIAGNOSIS — I6521 Occlusion and stenosis of right carotid artery: Secondary | ICD-10-CM | POA: Diagnosis present

## 2023-09-08 DIAGNOSIS — Z79890 Hormone replacement therapy (postmenopausal): Secondary | ICD-10-CM | POA: Diagnosis not present

## 2023-09-08 DIAGNOSIS — E871 Hypo-osmolality and hyponatremia: Secondary | ICD-10-CM | POA: Diagnosis not present

## 2023-09-08 DIAGNOSIS — I959 Hypotension, unspecified: Secondary | ICD-10-CM | POA: Diagnosis not present

## 2023-09-08 DIAGNOSIS — E1140 Type 2 diabetes mellitus with diabetic neuropathy, unspecified: Secondary | ICD-10-CM | POA: Diagnosis not present

## 2023-09-08 LAB — CK: Total CK: 162 U/L (ref 38–234)

## 2023-09-08 LAB — TROPONIN I (HIGH SENSITIVITY)
Troponin I (High Sensitivity): 163 ng/L (ref <18)
Troponin I (High Sensitivity): 159 ng/L
Troponin I (High Sensitivity): 192 ng/L (ref <18)

## 2023-09-08 LAB — BASIC METABOLIC PANEL WITH GFR
Anion gap: 10 (ref 5–15)
BUN: 16 mg/dL (ref 8–23)
CO2: 22 mmol/L (ref 22–32)
Calcium: 9.1 mg/dL (ref 8.9–10.3)
Chloride: 97 mmol/L — ABNORMAL LOW (ref 98–111)
Creatinine, Ser: 0.63 mg/dL (ref 0.44–1.00)
GFR, Estimated: >60 mL/min (ref >60)
Glucose, Bld: 190 mg/dL — ABNORMAL HIGH (ref 70–99)
Potassium: 4.1 mmol/L (ref 3.5–5.1)
Sodium: 129 mmol/L — ABNORMAL LOW (ref 135–145)

## 2023-09-08 LAB — APTT: aPTT: 33 s (ref 24–36)

## 2023-09-08 LAB — HEMOGLOBIN A1C
Hgb A1c MFr Bld: 9.9 % — ABNORMAL HIGH (ref 4.8–5.6)
Mean Plasma Glucose: 237.43 mg/dL

## 2023-09-08 LAB — TSH: TSH: 6.907 u[IU]/mL — ABNORMAL HIGH (ref 0.350–4.500)

## 2023-09-08 LAB — CBC
HCT: 38.5 % (ref 36.0–46.0)
Hemoglobin: 13.3 g/dL (ref 12.0–15.0)
MCH: 28.7 pg (ref 26.0–34.0)
MCHC: 34.5 g/dL (ref 30.0–36.0)
MCV: 83 fL (ref 80.0–100.0)
Platelets: 278 10*3/uL (ref 150–400)
RBC: 4.64 MIL/uL (ref 3.87–5.11)
RDW: 11.9 % (ref 11.5–15.5)
WBC: 7.7 10*3/uL (ref 4.0–10.5)
nRBC: 0 % (ref 0.0–0.2)

## 2023-09-08 LAB — LIPID PANEL
Cholesterol: 88 mg/dL (ref 0–200)
HDL: 31 mg/dL — ABNORMAL LOW
LDL Cholesterol: 40 mg/dL (ref 0–99)
Total CHOL/HDL Ratio: 2.8 ratio
Triglycerides: 85 mg/dL
VLDL: 17 mg/dL (ref 0–40)

## 2023-09-08 LAB — PROTIME-INR
INR: 1.1 (ref 0.8–1.2)
Prothrombin Time: 14.4 s (ref 11.4–15.2)

## 2023-09-08 LAB — PHOSPHORUS: Phosphorus: 3.7 mg/dL (ref 2.5–4.6)

## 2023-09-08 LAB — OSMOLALITY: Osmolality: 286 mosm/kg (ref 275–295)

## 2023-09-08 LAB — CBG MONITORING, ED: Glucose-Capillary: 165 mg/dL — ABNORMAL HIGH (ref 70–99)

## 2023-09-08 LAB — MAGNESIUM: Magnesium: 1.8 mg/dL (ref 1.7–2.4)

## 2023-09-08 MED ORDER — ACETAMINOPHEN 325 MG PO TABS: 650.0000 mg | ORAL_TABLET | Freq: Four times a day (QID) | ORAL | Status: DC | PRN

## 2023-09-08 MED ORDER — ASPIRIN 81 MG PO CHEW
324.0000 mg | CHEWABLE_TABLET | Freq: Once | ORAL | Status: AC
  Administered 2023-09-08: 162 mg via ORAL
  Filled 2023-09-08: qty 4

## 2023-09-08 MED ORDER — INSULIN ASPART 100 UNIT/ML IJ SOLN
0.0000 [IU] | Freq: Three times a day (TID) | INTRAMUSCULAR | Status: DC
  Administered 2023-09-08: 3 [IU] via SUBCUTANEOUS
  Administered 2023-09-09: 2 [IU] via SUBCUTANEOUS
  Administered 2023-09-09: 5 [IU] via SUBCUTANEOUS
  Filled 2023-09-08 (×3): qty 1

## 2023-09-08 MED ORDER — HEPARIN BOLUS VIA INFUSION
3600.0000 [IU] | Freq: Once | INTRAVENOUS | Status: AC
  Administered 2023-09-08: 3600 [IU] via INTRAVENOUS
  Filled 2023-09-08: qty 3600

## 2023-09-08 MED ORDER — ASPIRIN 81 MG PO TBEC
81.0000 mg | DELAYED_RELEASE_TABLET | Freq: Every day | ORAL | Status: DC
  Administered 2023-09-09 – 2023-09-10 (×2): 81 mg via ORAL
  Filled 2023-09-08 (×2): qty 1

## 2023-09-08 MED ORDER — ONDANSETRON HCL 4 MG PO TABS: 4.0000 mg | ORAL_TABLET | Freq: Four times a day (QID) | ORAL | Status: DC | PRN

## 2023-09-08 MED ORDER — ACETAMINOPHEN 650 MG RE SUPP: 650.0000 mg | Freq: Four times a day (QID) | RECTAL | Status: DC | PRN

## 2023-09-08 MED ORDER — SODIUM CHLORIDE 0.9 % IV SOLN: 250.0000 mL | INTRAVENOUS | Status: AC | PRN

## 2023-09-08 MED ORDER — LEVOTHYROXINE SODIUM 50 MCG PO TABS
50.0000 ug | ORAL_TABLET | Freq: Every day | ORAL | Status: DC
  Administered 2023-09-09 – 2023-09-10 (×2): 50 ug via ORAL
  Filled 2023-09-08 (×2): qty 1

## 2023-09-08 MED ORDER — SODIUM CHLORIDE 0.9% FLUSH: 3.0000 mL | INTRAVENOUS | Status: DC | PRN

## 2023-09-08 MED ORDER — HEPARIN (PORCINE) 25000 UT/250ML-% IV SOLN
950.0000 [IU]/h | INTRAVENOUS | Status: DC
  Administered 2023-09-08: 750 [IU]/h via INTRAVENOUS
  Administered 2023-09-09: 950 [IU]/h via INTRAVENOUS
  Filled 2023-09-08 (×2): qty 250

## 2023-09-08 MED ORDER — NITROGLYCERIN 0.4 MG SL SUBL: 0.4000 mg | SUBLINGUAL_TABLET | SUBLINGUAL | Status: DC | PRN

## 2023-09-08 MED ORDER — AMLODIPINE BESYLATE 5 MG PO TABS: 5.0000 mg | ORAL_TABLET | Freq: Every day | ORAL | Status: DC

## 2023-09-08 MED ORDER — MORPHINE SULFATE (PF) 2 MG/ML IV SOLN: 2.0000 mg | INTRAVENOUS | Status: DC | PRN

## 2023-09-08 MED ORDER — GABAPENTIN 100 MG PO CAPS
100.0000 mg | ORAL_CAPSULE | Freq: Three times a day (TID) | ORAL | Status: DC
  Administered 2023-09-08 – 2023-09-10 (×6): 100 mg via ORAL
  Filled 2023-09-08 (×6): qty 1

## 2023-09-08 MED ORDER — SODIUM CHLORIDE 0.9 % IV BOLUS
500.0000 mL | Freq: Once | INTRAVENOUS | Status: AC
  Administered 2023-09-08: 500 mL via INTRAVENOUS

## 2023-09-08 MED ORDER — SODIUM CHLORIDE 0.9% FLUSH
3.0000 mL | Freq: Two times a day (BID) | INTRAVENOUS | Status: DC
  Administered 2023-09-08 – 2023-09-09 (×4): 3 mL via INTRAVENOUS

## 2023-09-08 MED ORDER — HYDRALAZINE HCL 20 MG/ML IJ SOLN
10.0000 mg | Freq: Four times a day (QID) | INTRAMUSCULAR | Status: DC | PRN
  Administered 2023-09-08: 10 mg via INTRAVENOUS
  Filled 2023-09-08: qty 1

## 2023-09-08 MED ORDER — LOSARTAN POTASSIUM 50 MG PO TABS
50.0000 mg | ORAL_TABLET | Freq: Every day | ORAL | Status: DC
  Administered 2023-09-09 – 2023-09-10 (×2): 50 mg via ORAL
  Filled 2023-09-08 (×2): qty 1

## 2023-09-08 MED ORDER — ONDANSETRON HCL 4 MG/2ML IJ SOLN: 4.0000 mg | Freq: Four times a day (QID) | INTRAMUSCULAR | Status: DC | PRN

## 2023-09-08 MED ORDER — ATORVASTATIN CALCIUM 20 MG PO TABS
20.0000 mg | ORAL_TABLET | Freq: Every day | ORAL | Status: DC
  Administered 2023-09-08 – 2023-09-09 (×2): 20 mg via ORAL
  Filled 2023-09-08 (×2): qty 1

## 2023-09-08 NOTE — ED Notes (Signed)
 This RN called dietary to get Halal meal for pt.

## 2023-09-08 NOTE — ED Provider Triage Note (Signed)
 Emergency Medicine Provider Triage Evaluation Note  Sheena Parker , a 73 y.o. female  was evaluated in triage.  Pt complains of chest pain, weakness for 2 days.  Talk to her cardiologist and they told her to come to the ED.  Patient been working outside in the heat when she became weak..  Review of Systems  Positive:  Negative:   Physical Exam  Ht 5' (1.524 m)   Wt 64 kg   BMI 27.54 kg/m  Gen:   Awake, no distress   Resp:  Normal effort  MSK:   Moves extremities without difficulty  Other:    Medical Decision Making  Medically screening exam initiated at 12:13 PM.  Appropriate orders placed.  Raynette Denault was informed that the remainder of the evaluation will be completed by another provider, this initial triage assessment does not replace that evaluation, and the importance of remaining in the ED until their evaluation is complete.  ACS protocols, also add CK   Delsie Figures, PA-C 09/08/23 1214

## 2023-09-08 NOTE — ED Provider Notes (Signed)
 [facility] Provider Note    Event Date/Time   First MD Initiated Contact with Patient 09/08/23 1300     (approximate)   History   Chest Pain   HPI  Sheena Parker is a 73 y.o. female past medical history significant for diabetes, hypertension, who presents to the emergency department with chest pain.  Episode of chest pain while working in her garden today, substernal chest pain that radiated to her left shoulder and jaw associated with some shortness of breath and nausea, was feeling slightly lightheaded.  Has been very fatigued over the past 2 days.  Denies any similar episodes in the past.  Denies any nausea or vomiting.  States that her symptoms lasted for approximately 15 minutes and then improved.  Denies any chest discomfort at this time.  No prior stress testing or cardiac catheterization.  Denies any history of DVT or PE.     Physical Exam   Triage Vital Signs: ED Triage Vitals  Encounter Vitals Group     BP 09/08/23 1213 (!) 221/68     Systolic BP Percentile --      Diastolic BP Percentile --      Pulse Rate 09/08/23 1213 (!) 57     Resp 09/08/23 1213 16     Temp 09/08/23 1213 97.7 F (36.5 C)     Temp Source 09/08/23 1213 Oral     SpO2 09/08/23 1213 100 %     Weight 09/08/23 1210 141 lb (64 kg)     Height 09/08/23 1210 5' (1.524 m)     Head Circumference --      Peak Flow --      Pain Score 09/08/23 1209 8     Pain Loc --      Pain Education --      Exclude from Growth Chart --     Most recent vital signs: Vitals:   09/08/23 1213  BP: (!) 221/68  Pulse: (!) 57  Resp: 16  Temp: 97.7 F (36.5 C)  SpO2: 100%    Physical Exam Constitutional:      Appearance: She is well-developed.  HENT:     Head: Atraumatic.  Eyes:     Conjunctiva/sclera: Conjunctivae normal.  Cardiovascular:     Rate and Rhythm: Regular rhythm.     Heart sounds: Normal heart sounds.  Pulmonary:     Effort: No respiratory distress.  Abdominal:      General: There is no distension.  Musculoskeletal:        General: Normal range of motion.     Cervical back: Normal range of motion.     Right lower leg: No edema.     Left lower leg: No edema.     Comments: Intact and symmetric DP pulses and radial pulses.  Skin:    General: Skin is warm.     Capillary Refill: Capillary refill takes less than 2 seconds.  Neurological:     Mental Status: She is alert. Mental status is at baseline.     IMPRESSION / MDM / ASSESSMENT AND PLAN / ED COURSE  I reviewed the triage vital signs and the nursing notes.  Differential diagnosis including ACS, anemia, pneumonia, pericarditis.  No tearing chest pain and pulses are equal and symmetric, have low suspicion for dissection.  Low suspicion for pulmonary embolism.  Took 2 baby aspirin  prior to arrival  EKG  I, Viviano Ground, the attending physician, personally viewed and interpreted this ECG.  Sinus bradycardia  with a heart rate of 54.  Normal intervals.  No chamber enlargement.  ST depression to the inferior leads.  T wave inverted that is isolated to lead III.  No prior EKG to compare  Sinus bradycardia while on cardiac telemetry.  RADIOLOGY I independently reviewed imaging, my interpretation of imaging: No widened mediastinum.  No pulmonary edema  LABS (all labs ordered are listed, but only abnormal results are displayed) Labs interpreted as -    Labs Reviewed  BASIC METABOLIC PANEL WITH GFR - Abnormal; Notable for the following components:      Result Value   Sodium 129 (*)    Chloride 97 (*)    Glucose, Bld 190 (*)    All other components within normal limits  TROPONIN I (HIGH SENSITIVITY) - Abnormal; Notable for the following components:   Troponin I (High Sensitivity) 163 (*)    All other components within normal limits  CBC  CK     MDM  Patient currently chest pain-free.  Does have hyponatremia, appears euvolemic.  Will do a 500 bolus.  Troponin elevated at 160.  Currently  chest pain-free.  EKG without ST elevation.  Does have some mild depression to the inferior leads.  No prior EKG to compare.  Does have a history of hyponatremia on lab work.  Completed 325 mg of aspirin  and started on heparin  given concern for NSTEMI.  Consulted hospitalist for admission.  Currently chest pain-free.     PROCEDURES:  Critical Care performed: yes  .Critical Care  Performed by: Viviano Ground, MD Authorized by: Viviano Ground, MD   Critical care provider statement:    Critical care time (minutes):  30   Critical care time was exclusive of:  Separately billable procedures and treating other patients   Critical care was necessary to treat or prevent imminent or life-threatening deterioration of the following conditions:  Cardiac failure   Critical care was time spent personally by me on the following activities:  Development of treatment plan with patient or surrogate, discussions with consultants, evaluation of patient's response to treatment, examination of patient, ordering and review of laboratory studies, ordering and review of radiographic studies, ordering and performing treatments and interventions, pulse oximetry, re-evaluation of patient's condition and review of old charts   Care discussed with: admitting provider     Patient's presentation is most consistent with acute presentation with potential threat to life or bodily function.   MEDICATIONS ORDERED IN ED: Medications  sodium chloride  0.9 % bolus 500 mL (has no administration in time range)  aspirin  chewable tablet 324 mg (162 mg Oral Given 09/08/23 1313)    FINAL CLINICAL IMPRESSION(S) / ED DIAGNOSES   Final diagnoses:  Chest pain, unspecified type  Hyponatremia     Rx / DC Orders   ED Discharge Orders     None        Note:  This document was prepared using Dragon voice recognition software and may include unintentional dictation errors.   Viviano Ground, MD 09/08/23 1334

## 2023-09-08 NOTE — ED Notes (Signed)
 Troponin in 160s called by lab. EDP informed.

## 2023-09-08 NOTE — H&P (Signed)
 [facility] History and Physical   Patient: Sheena Parker ZOX:[id number]   PCP: Theron Flavin, MD DOB: 09-Aug-1950   DOA: 09/08/2023   DOS: 09/08/2023   DOS: the patient was seen and examined on 09/08/2023  Patient coming from: The patient is coming from Home  Chief Complaint: Chest pain  HPI: Allan Urbaniak is a 73 y.o. female with PMH of HTN, NIDDM T2, peripheral neuropathy, hypothyroid, carotid stenosis s/p left ICA stent 6/21,/significant stenosis on the right carotid artery, workup pending as per vascular surgery, presented at [facility] ED with complaining of chest pain.  As per patient she was walking around at home with little exertion to get work done at home and suddenly she felt left-sided chest pain, pressure and lasted for 15 minutes.  Pain resolved with rest and drinking water.  Patient also felt sweating.  Did not take any medication at home.  In the ED patient was chest pain-free.  ED Course: VS afebrile, heart rate 57, RR 16, BP 221/68, 100% on room air BMP, sodium 129, serum glucose 190 elevated Troponin 163 ----159 CBC within normal range LDL 40 very low TSH 6.9 elevated  Patient was started on heparin  IV infusion, TRH was consulted for admission. Cardiology was consulted.   Review of Systems: as mentioned in the history of present illness.  All other systems reviewed and are negative.  Past Medical History:  Diagnosis Date   Anemia 02/16/2020   Arthritis    KNEE, hands   Diabetes mellitus without complication (HCC)    Difficult intubation    Gall stones 10/31/2016   GERD (gastroesophageal reflux disease)    NO MEDS   Hyperlipidemia    Hypertension    Hypothyroidism    Presence of dental prosthetic device    Dental implants,  top - front 2 teeth   Thyroid  disease    Weight decrease 02/16/2020   Weight loss 02/16/2020   Past Surgical History:  Procedure Laterality Date   CAROTID PTA/STENT INTERVENTION Left 10/30/2019   Procedure: CAROTID PTA/STENT INTERVENTION;   Surgeon: Celso College, MD;  Location: [facility] INVASIVE CV LAB;  Service: Cardiovascular;  Laterality: Left;   CATARACT EXTRACTION W/PHACO Right 12/16/2019   Procedure: CATARACT EXTRACTION PHACO AND INTRAOCULAR LENS PLACEMENT (IOC) RIGHT DIABETIC 7.57 00:55.0;  Surgeon: Clair Crews, MD;  Location: [facility];  Service: Ophthalmology;  Laterality: Right;  Diabetic - oral meds   CATARACT EXTRACTION W/PHACO Left 01/06/2020   Procedure: CATARACT EXTRACTION PHACO AND INTRAOCULAR LENS PLACEMENT (IOC) LEFT DIABETIC 7.12  00:44.5;  Surgeon: Clair Crews, MD;  Location: [facility];  Service: Ophthalmology;  Laterality: Left;   CHOLECYSTECTOMY N/A 11/14/2016   Procedure: LAPAROSCOPIC CHOLECYSTECTOMY WITH INTRAOPERATIVE CHOLANGIOGRAM;  Surgeon: Marshall Skeeter, MD;  Location: [facility] ORS;  Service: General;  Laterality: N/A;   COLONOSCOPY WITH PROPOFOL  N/A 03/17/2019   Procedure: COLONOSCOPY WITH PROPOFOL ;  Surgeon: Irby Mannan, MD;  Location: [facility] ENDOSCOPY;  Service: Endoscopy;  Laterality: N/A;   Social History:  reports that she has never smoked. She has never used smokeless tobacco. She reports that she does not drink alcohol and does not use drugs.  No Known Allergies   Family history reviewed and not pertinent Family History  Problem Relation Age of Onset   Breast cancer Mother 78   Breast cancer Sister 36   Breast cancer Sister 89   Breast cancer Sister 50   Colon cancer Neg Hx      Prior to Admission medications   Medication  Sig Start Date End Date Taking? Authorizing Provider  acetaminophen  (TYLENOL ) 325 MG tablet Take 650 mg by mouth every 6 (six) hours as needed for mild pain or headache.    Yes [provider]  amLODipine  (NORVASC ) 5 MG tablet Take 1 tablet (5 mg total) by mouth daily. 12/06/21  Yes Ferdinand, Pandora Bogaert, MD  aspirin  EC 81 MG tablet Take 81 mg by mouth daily.   Yes [provider]  atorvastatin  (LIPITOR) 20 MG tablet Take 1  tablet (20 mg total) by mouth at bedtime. 12/06/21  Yes Jeanbaptiste, Pandora Bogaert, MD  Cholecalciferol (VITAMIN D ) 125 MCG (5000 UT) CAPS 1  Capsule daily 12/08/19  Yes Pasion, Pandora Bogaert, MD  gabapentin  (NEURONTIN ) 100 MG capsule Take 1 capsule (100 mg total) by mouth 3 (three) times daily. 12/06/21  Yes Lich, Pandora Bogaert, MD  levothyroxine  (SYNTHROID ) 50 MCG tablet Take 1 tablet (50 mcg total) by mouth daily before breakfast. 12/06/21  Yes Mole, Pandora Bogaert, MD  losartan  (COZAAR ) 50 MG tablet Take 1 tablet (50 mg total) by mouth 2 (two) times daily. Patient taking differently: Take 50 mg by mouth daily. 12/06/21  Yes Poucher, Pandora Bogaert, MD  metFORMIN  (GLUCOPHAGE ) 500 MG tablet Take 1 tablet (500 mg total) by mouth 2 (two) times daily. 12/06/21  Yes Crisafulli, Pandora Bogaert, MD  glucose blood test strip DX E11.9  Check blood sugar daily 04/21/21   Theron Flavin, MD    Physical Exam: Vitals:   09/08/23 1210 09/08/23 1213  BP:  (!) 221/68  Pulse:  (!) 57  Resp:  16  Temp:  97.7 F (36.5 C)  TempSrc:  Oral  SpO2:  100%  Weight: 64 kg   Height: 5' (1.524 m)     General: alert and oriented to time, place, and person. Appear in mild distress, affect appropriate Eyes: PERRLA, Conjunctiva normal ENT: Oral Mucosa Clear, moist  Neck: No JVD, no Abnormal Mass Or lumps Cardiovascular: S1 and S2 Present, no Murmur, peripheral pulses symmetrical Respiratory: good respiratory effort, Bilateral Air entry equal and Decreased, no signs of accessory muscle use, Clear to Auscultation, no Crackles, no wheezes Abdomen: Bowel Sound present, Soft and no tenderness, no hernia Skin: no rashes  Extremities: no Pedal edema, no calf tenderness Neurologic: without any new focal findings Gait not checked due to patient safety concerns  Data Reviewed: I have personally reviewed and interpreted labs, imaging as discussed below.  CBC: Recent Labs  Lab 09/08/23 1215  WBC 7.7  HGB 13.3  HCT 38.5  MCV 83.0  PLT 278   Basic Metabolic Panel: Recent Labs   Lab 09/08/23 1215  NA 129*  K 4.1  CL 97*  CO2 22  GLUCOSE 190*  BUN 16  CREATININE 0.63  CALCIUM  9.1   GFR: Estimated Creatinine Clearance: 53.9 mL/min (by C-G formula based on SCr of 0.63 mg/dL). Liver Function Tests: No results for input(s): "AST", "ALT", "ALKPHOS", "BILITOT", "PROT", "ALBUMIN" in the last 168 hours. No results for input(s): "LIPASE", "AMYLASE" in the last 168 hours. No results for input(s): "AMMONIA" in the last 168 hours. Coagulation Profile: No results for input(s): "INR", "PROTIME" in the last 168 hours. Cardiac Enzymes: Recent Labs  Lab 09/08/23 1215  CKTOTAL 162   BNP (last 3 results) No results for input(s): "PROBNP" in the last 8760 hours. HbA1C: No results for input(s): "HGBA1C" in the last 72 hours. CBG: No results for input(s): "GLUCAP" in the last 168 hours. Lipid Profile: No results for input(s): "CHOL", "HDL", "LDLCALC", "TRIG", "CHOLHDL", "LDLDIRECT" in  the last 72 hours. Thyroid  Function Tests: No results for input(s): "TSH", "T4TOTAL", "FREET4", "T3FREE", "THYROIDAB" in the last 72 hours. Anemia Panel: No results for input(s): "VITAMINB12", "FOLATE", "FERRITIN", "TIBC", "IRON", "RETICCTPCT" in the last 72 hours. Urine analysis:    Component Value Date/Time   BILIRUBINUR neg 01/26/2021 1546   PROTEINUR Negative 01/26/2021 1546   UROBILINOGEN negative (A) 01/26/2021 1546   NITRITE neg 01/26/2021 1546   LEUKOCYTESUR Small (1+) (A) 01/26/2021 1546    Radiological Exams on Admission: DG Chest 2 View Result Date: 09/08/2023 CLINICAL DATA:  Chest pain EXAM: CHEST - 2 VIEW COMPARISON:  None Available. FINDINGS: Lungs are clear.  No pneumothorax. Heart size and mediastinal contours are within normal limits. Aortic Atherosclerosis (ICD10-170.0). No effusion. Visualized bones unremarkable.  Left carotid stent. IMPRESSION: No acute cardiopulmonary disease. Electronically Signed   By: Nicoletta Barrier M.D.   On: 09/08/2023 12:35   EKG:  Independently reviewed.  Sinus bradycardia, no significant changes   I reviewed all nursing notes, pharmacy notes, vitals, pertinent old records.  Assessment/Plan Principal Problem:   NSTEMI (non-ST elevated myocardial infarction) [facility])  # NSTEMI (non-ST elevated myocardial infarction) (HCC) Chest pain, most likely due to hypertensive emergency Troponin elevated most likely demand ischemia Troponin trending down Chest pain resolved Continue aspirin  81 mg p.o. daily, nitroglycerin as needed Continue heparin  IV infusion for now Continue to monitor on telemetry Follow 2D echocardiogram Cardiology consult appreciated, may need stress test versus cardiac cath   # Hypertensive emergency Resumed home meds amlodipine  5 mg p.o. daily and losartan  50 mg p.o. daily IV hydralazine  as needed Monitor BP and titrate medications accordingly  # Carotid stenosis S/p left ICA stent in 10/2019 Right ICA 60 to 79% stenosis, patient will need CTA neck and follow-up as an outpatient with vascular surgery   # Hypothyroidism Continued home dose Synthroid  50 mcg p.o. daily TSH 5.9 elevated, recommend to follow with PCP to titrate dose accordingly  # NIDDM T2, with peripheral neuropathy Held metformin  for now Resumed gabapentin  home dose Started NovoLog sliding scale Monitor CBG, continue diabetic diet   Nutrition: Cardiac and Carb modified diet DVT Prophylaxis: Therapeutic Anticoagulation with heparin  IV infusion  Advance goals of care discussion: Full code   Consults: I personally Discussed with cardiology  Family Communication: family was present at bedside, at the time of interview.  Opportunity was given to ask question and all questions were answered satisfactorily.  Disposition: Admitted as inpatient, cardiac telemetry unit. Likely to be discharged home, in 2-3 days when stable and cleared by cardiology.  I have discussed plan of care as described above with RN and  patient/family.  Severity of Illness: The appropriate patient status for this patient is INPATIENT. Inpatient status is judged to be reasonable and necessary in order to provide the required intensity of service to ensure the patient's safety. The patient's presenting symptoms, physical exam findings, and initial radiographic and laboratory data in the context of their chronic comorbidities is felt to place them at high risk for further clinical deterioration. Furthermore, it is not anticipated that the patient will be medically stable for discharge from the hospital within 2 midnights of admission.   * I certify that at the point of admission it is my clinical judgment that the patient will require inpatient hospital care spanning beyond 2 midnights from the point of admission due to high intensity of service, high risk for further deterioration and high frequency of surveillance required.*   Author: Althia Atlas, MD [facility] 09/08/2023 2:43 PM   To reach On-call, see care teams to locate the attending and reach out to them via [email or web address]. If 7PM-7AM, please contact night-coverage If you still have difficulty reaching the attending provider, please page the [facility] (Director on Call) for [facility] on amion for assistance.

## 2023-09-08 NOTE — ED Triage Notes (Signed)
 Pt presents to ER from home with complaints of chest since 2 am. Pt reports chest pressure and pain to jaw. Pt accompanied by son. Pt's also has had shortness of breath.  Pt talked to cardiologist and sent to ER

## 2023-09-08 NOTE — Progress Notes (Signed)
 PHARMACY - ANTICOAGULATION CONSULT NOTE  Pharmacy Consult for heparin  infusion Indication: chest pain/ACS  No Known Allergies  Patient Measurements: Height: 5' (152.4 cm) Weight: 64 kg (141 lb) IBW/kg (Calculated) : 45.5 HEPARIN  DW (KG): 59  Vital Signs: Temp: 97.7 F (36.5 C) (05/03 1213) Temp Source: Oral (05/03 1213) BP: 221/68 (05/03 1213) Pulse Rate: 57 (05/03 1213)  Labs: Recent Labs    09/08/23 1215  HGB 13.3  HCT 38.5  PLT 278  CREATININE 0.63  CKTOTAL 162  TROPONINIHS 163*    Estimated Creatinine Clearance: 53.9 mL/min (by C-G formula based on SCr of 0.63 mg/dL).   Medical History: Past Medical History:  Diagnosis Date   Anemia 02/16/2020   Arthritis    KNEE, hands   Diabetes mellitus without complication (HCC)    Difficult intubation    Gall stones 10/31/2016   GERD (gastroesophageal reflux disease)    NO MEDS   Hyperlipidemia    Hypertension    Hypothyroidism    Presence of dental prosthetic device    Dental implants,  top - front 2 teeth   Thyroid  disease    Weight decrease 02/16/2020   Weight loss 02/16/2020    Assessment: 73 y.o. female past medical history significant for diabetes, hypertension, who presents to the emergency department with chest pain.  A review of medical records reveals no chronic anticoagulation prior to arrival  Baseline Labs: pending  Goal of Therapy:  Heparin  level 0.3-0.7 units/ml Monitor platelets by anticoagulation protocol: Yes   Plan:  Give 3600 units bolus x 1 Start heparin  infusion at 750 units/hr Check anti-Xa level in 8 hours and daily while on heparin  Continue to monitor H&H and platelets  Adalberto Acton 09/08/2023,1:47 PM

## 2023-09-08 NOTE — Consult Note (Signed)
 CARDIOLOGY CONSULT NOTE               Patient ID: Sheena Parker MRN: [id number] DOB/AGE: Apr 09, 1951 73 y.o.  Admit date: 09/08/2023 Referring Physician Dr Althia Atlas hospitalist Primary Physician Dr. Brantley Caldwell primary Primary Cardiologist Riese Reason for Consultation chest pain elevated troponin  HPI: 73 year old female history of multiple medical problems including hypertension diabetes hyperlipidemia peripheral vascular disease presenting with chest pain over the last 24 to 48 hours midsternal no radiation mild shortness of breath no blackout spells or syncope denies any previous cardiac evaluation has had peripheral vascular disease with stenting to carotids about 5 years ago patient reportedly is compliant with medication but with the chest pain symptoms she presented to the emergency room for evaluation was found to have borderline troponins unremarkable EKG but cardiology was consulted for further evaluation  Review of systems complete and found to be negative unless listed above     Past Medical History:  Diagnosis Date   Anemia 02/16/2020   Arthritis    KNEE, hands   Diabetes mellitus without complication (HCC)    Difficult intubation    Gall stones 10/31/2016   GERD (gastroesophageal reflux disease)    NO MEDS   Hyperlipidemia    Hypertension    Hypothyroidism    Presence of dental prosthetic device    Dental implants,  top - front 2 teeth   Thyroid  disease    Weight decrease 02/16/2020   Weight loss 02/16/2020    Past Surgical History:  Procedure Laterality Date   CAROTID PTA/STENT INTERVENTION Left 10/30/2019   Procedure: CAROTID PTA/STENT INTERVENTION;  Surgeon: Celso College, MD;  Location: [facility] INVASIVE CV LAB;  Service: Cardiovascular;  Laterality: Left;   CATARACT EXTRACTION W/PHACO Right 12/16/2019   Procedure: CATARACT EXTRACTION PHACO AND INTRAOCULAR LENS PLACEMENT (IOC) RIGHT DIABETIC 7.57 00:55.0;  Surgeon: Clair Crews, MD;  Location:  [facility];  Service: Ophthalmology;  Laterality: Right;  Diabetic - oral meds   CATARACT EXTRACTION W/PHACO Left 01/06/2020   Procedure: CATARACT EXTRACTION PHACO AND INTRAOCULAR LENS PLACEMENT (IOC) LEFT DIABETIC 7.12  00:44.5;  Surgeon: Clair Crews, MD;  Location: [facility];  Service: Ophthalmology;  Laterality: Left;   CHOLECYSTECTOMY N/A 11/14/2016   Procedure: LAPAROSCOPIC CHOLECYSTECTOMY WITH INTRAOPERATIVE CHOLANGIOGRAM;  Surgeon: Marshall Skeeter, MD;  Location: [facility] ORS;  Service: General;  Laterality: N/A;   COLONOSCOPY WITH PROPOFOL  N/A 03/17/2019   Procedure: COLONOSCOPY WITH PROPOFOL ;  Surgeon: Irby Mannan, MD;  Location: [facility] ENDOSCOPY;  Service: Endoscopy;  Laterality: N/A;    (Not in a hospital admission)  Social History   Socioeconomic History   Marital status: Married    Spouse name: Not on file   Number of children: Not on file   Years of education: Not on file   Highest education level: Not on file  Occupational History   Not on file  Tobacco Use   Smoking status: Never   Smokeless tobacco: Never  Vaping Use   Vaping status: Never Used  Substance and Sexual Activity   Alcohol use: No   Drug use: No   Sexual activity: Not Currently  Other Topics Concern   Not on file  Social History Narrative   Not on file   Social Drivers of Health   Financial Resource Strain: Low Risk  (05/03/2021)   Overall Financial Resource Strain (CARDIA)    Difficulty of Paying Living Expenses: Not hard at all  Food Insecurity: No Food Insecurity (04/21/2021)  Hunger Vital Sign    Worried About Running Out of Food in the Last Year: Never true    Ran Out of Food in the Last Year: Never true  Transportation Needs: No Transportation Needs (04/21/2021)   PRAPARE - Administrator, Civil Service (Medical): No    Lack of Transportation (Non-Medical): No  Physical Activity: Insufficiently Active (04/21/2021)   Exercise Vital Sign    Days  of Exercise per Week: 4 days    Minutes of Exercise per Session: 20 min  Stress: No Stress Concern Present (04/21/2021)   [employer] of Occupational Health - Occupational Stress Questionnaire    Feeling of Stress : Not at all  Social Connections: Socially Integrated (04/21/2021)   Social Connection and Isolation Panel [NHANES]    Frequency of Communication with Friends and Family: More than three times a week    Frequency of Social Gatherings with Friends and Family: Twice a week    Attends Religious Services: More than 4 times per year    Active Member of [employer] or Organizations: Yes    Attends Engineer, structural: More than 4 times per year    Marital Status: Married  Catering manager Violence: Not At Risk (05/03/2021)   Humiliation, Afraid, Rape, and Kick questionnaire    Fear of Current or Ex-Partner: No    Emotionally Abused: No    Physically Abused: No    Sexually Abused: No    Family History  Problem Relation Age of Onset   Breast cancer Mother 9   Breast cancer Sister 46   Breast cancer Sister 33   Breast cancer Sister 38   Colon cancer Neg Hx       Review of systems complete and found to be negative unless listed above      PHYSICAL EXAM  General: Well developed, well nourished, in no acute distress HEENT:  Normocephalic and atramatic Neck:  No JVD.  Lungs: Clear bilaterally to auscultation and percussion. Heart: HRRR . Normal S1 and S2 without gallops or murmurs.  Abdomen: Bowel sounds are positive, abdomen soft and non-tender  Msk:  Back normal, normal gait. Normal strength and tone for age. Extremities: No clubbing, cyanosis or edema.   Neuro: Alert and oriented X 3. Psych:  Good affect, responds appropriately  Labs:   Lab Results  Component Value Date   WBC 7.7 09/08/2023   HGB 13.3 09/08/2023   HCT 38.5 09/08/2023   MCV 83.0 09/08/2023   PLT 278 09/08/2023    Recent Labs  Lab 09/08/23 1215  NA 129*  K 4.1  CL 97*  CO2 22   BUN 16  CREATININE 0.63  CALCIUM  9.1  GLUCOSE 190*   Lab Results  Component Value Date   CKTOTAL 162 09/08/2023    Lab Results  Component Value Date   CHOL 117 09/17/2020   Lab Results  Component Value Date   HDL 53 09/17/2020   Lab Results  Component Value Date   LDLCALC 51 09/17/2020   Lab Results  Component Value Date   TRIG 46 09/17/2020   Lab Results  Component Value Date   CHOLHDL 2.2 09/17/2020   No results found for: "LDLDIRECT"    Radiology: DG Chest 2 View Result Date: 09/08/2023 CLINICAL DATA:  Chest pain EXAM: CHEST - 2 VIEW COMPARISON:  None Available. FINDINGS: Lungs are clear.  No pneumothorax. Heart size and mediastinal contours are within normal limits. Aortic Atherosclerosis (ICD10-170.0). No effusion. Visualized bones unremarkable.  Left carotid stent. IMPRESSION: No acute cardiopulmonary disease. Electronically Signed   By: Nicoletta Barrier M.D.   On: 09/08/2023 12:35    EKG: Normal sinus rhythm nonspecific T wave changes rate of 70  ASSESSMENT AND PLAN:  Chest Pain SOB PVD Diabetes type 2 Hyperlipidemia Elevated troponin more consistent with demand ischemia Borderline obesity . Plan Agreed admit follow-up troponins EKGs and telemetry Continue telemetry for evaluation of any arrhythmias Continue medical therapy for chest pain consider functional study versus cardiac cath Continue diabetes management and control with metformin  Maintain statin therapy for hyperlipidemia with Lipitor consider dose adjustment based on readings.  Secondary prevention because of known peripheral vascular disease and multiple risk factors including diabetes Continue low-dose aspirin  therapy Agree with hypertension management continue losartan  and amlodipine  Consider echocardiogram for evaluation of left ventricular function wall motion and valvular structures    Signed: Antonette Batters MD 09/08/2023, 2:41 PM

## 2023-09-09 ENCOUNTER — Inpatient Hospital Stay
Admit: 2023-09-09 | Discharge: 2023-09-09 | Disposition: A | Discharge status: Home / Self Care | Attending: Student | Admitting: Student

## 2023-09-09 ENCOUNTER — Other Ambulatory Visit: Payer: Self-pay

## 2023-09-09 DIAGNOSIS — R079 Chest pain, unspecified: Secondary | ICD-10-CM | POA: Diagnosis not present

## 2023-09-09 DIAGNOSIS — I214 Non-ST elevation (NSTEMI) myocardial infarction: Secondary | ICD-10-CM | POA: Diagnosis not present

## 2023-09-09 LAB — TROPONIN I (HIGH SENSITIVITY): Troponin I (High Sensitivity): 203 ng/L (ref <18)

## 2023-09-09 LAB — CBC
HCT: 37.8 % (ref 36.0–46.0)
Hemoglobin: 13 g/dL (ref 12.0–15.0)
MCH: 28.5 pg (ref 26.0–34.0)
MCHC: 34.4 g/dL (ref 30.0–36.0)
MCV: 82.9 fL (ref 80.0–100.0)
Platelets: 264 10*3/uL (ref 150–400)
RBC: 4.56 MIL/uL (ref 3.87–5.11)
RDW: 12 % (ref 11.5–15.5)
WBC: 9.3 10*3/uL (ref 4.0–10.5)
nRBC: 0 % (ref 0.0–0.2)

## 2023-09-09 LAB — HEPARIN LEVEL (UNFRACTIONATED)
Heparin Unfractionated: 0.19 [IU]/mL — ABNORMAL LOW (ref 0.30–0.70)
Heparin Unfractionated: 0.42 [IU]/mL (ref 0.30–0.70)
Heparin Unfractionated: 0.42 [IU]/mL (ref 0.30–0.70)

## 2023-09-09 LAB — CBG MONITORING, ED
Glucose-Capillary: 147 mg/dL — ABNORMAL HIGH (ref 70–99)
Glucose-Capillary: 159 mg/dL — ABNORMAL HIGH (ref 70–99)
Glucose-Capillary: 178 mg/dL — ABNORMAL HIGH (ref 70–99)
Glucose-Capillary: 231 mg/dL — ABNORMAL HIGH (ref 70–99)

## 2023-09-09 LAB — BASIC METABOLIC PANEL WITH GFR
Anion gap: 9 (ref 5–15)
BUN: 16 mg/dL (ref 8–23)
CO2: 24 mmol/L (ref 22–32)
Calcium: 9.1 mg/dL (ref 8.9–10.3)
Chloride: 99 mmol/L (ref 98–111)
Creatinine, Ser: 0.6 mg/dL (ref 0.44–1.00)
GFR, Estimated: >60 mL/min (ref >60)
Glucose, Bld: 167 mg/dL — ABNORMAL HIGH (ref 70–99)
Potassium: 4.1 mmol/L (ref 3.5–5.1)
Sodium: 132 mmol/L — ABNORMAL LOW (ref 135–145)

## 2023-09-09 LAB — GLUCOSE, CAPILLARY: Glucose-Capillary: 150 mg/dL — ABNORMAL HIGH (ref 70–99)

## 2023-09-09 LAB — PHOSPHORUS: Phosphorus: 4.3 mg/dL (ref 2.5–4.6)

## 2023-09-09 LAB — MAGNESIUM: Magnesium: 1.9 mg/dL (ref 1.7–2.4)

## 2023-09-09 MED ORDER — HEPARIN BOLUS VIA INFUSION
1800.0000 [IU] | Freq: Once | INTRAVENOUS | Status: AC
  Administered 2023-09-09: 1800 [IU] via INTRAVENOUS
  Filled 2023-09-09: qty 1800

## 2023-09-09 MED ORDER — AMLODIPINE BESYLATE 10 MG PO TABS
10.0000 mg | ORAL_TABLET | Freq: Every day | ORAL | Status: DC
  Administered 2023-09-09 – 2023-09-10 (×2): 10 mg via ORAL
  Filled 2023-09-09: qty 1
  Filled 2023-09-09: qty 2

## 2023-09-09 MED ORDER — SODIUM CHLORIDE 0.9% FLUSH
3.0000 mL | Freq: Two times a day (BID) | INTRAVENOUS | Status: DC
  Administered 2023-09-09 (×2): 3 mL via INTRAVENOUS

## 2023-09-09 NOTE — ED Notes (Signed)
 Patient requested items for a wash up. Patient washing up at this time.

## 2023-09-09 NOTE — ED Notes (Signed)
 Patient used the bed side commode and urinated and had a bowel movement.

## 2023-09-09 NOTE — Progress Notes (Signed)
 PHARMACY - ANTICOAGULATION CONSULT NOTE  Pharmacy Consult for heparin  infusion Indication: chest pain/ACS  No Known Allergies  Patient Measurements: Height: 5' (152.4 cm) Weight: 64 kg (141 lb) IBW/kg (Calculated) : 45.5 HEPARIN  DW (KG): 59  Vital Signs: Temp: 97.9 F (36.6 C) (05/03 2326) Temp Source: Oral (05/03 2326) BP: 135/50 (05/03 2000) Pulse Rate: 52 (05/03 2000)  Labs: Recent Labs    09/08/23 1215 09/08/23 1433 09/08/23 1757 09/08/23 2331  HGB 13.3  --   --   --   HCT 38.5  --   --   --   PLT 278  --   --   --   APTT  --  33  --   --   LABPROT  --  14.4  --   --   INR  --  1.1  --   --   HEPARINUNFRC  --   --   --  0.19*  CREATININE 0.63  --   --   --   CKTOTAL 162  --   --   --   TROPONINIHS 163* 159* 192* 203*    Estimated Creatinine Clearance: 53.9 mL/min (by C-G formula based on SCr of 0.63 mg/dL).   Medical History: Past Medical History:  Diagnosis Date   Anemia 02/16/2020   Arthritis    KNEE, hands   Diabetes mellitus without complication (HCC)    Difficult intubation    Gall stones 10/31/2016   GERD (gastroesophageal reflux disease)    NO MEDS   Hyperlipidemia    Hypertension    Hypothyroidism    Presence of dental prosthetic device    Dental implants,  top - front 2 teeth   Thyroid  disease    Weight decrease 02/16/2020   Weight loss 02/16/2020    Assessment: 73 y.o. female past medical history significant for diabetes, hypertension, who presents to the emergency department with chest pain.  A review of medical records reveals no chronic anticoagulation prior to arrival  Baseline Labs: pending  Goal of Therapy:  Heparin  level 0.3-0.7 units/ml Monitor platelets by anticoagulation protocol: Yes  05/03 2331 HL 0.19, subtherapeutic   Plan:  Bolus 1800 units x 1 Increase heparin  infusion to 950 units/hr Recheck HL in 8 hrs after rate change CBC daily while on heparin   Coretta Dexter, PharmD, [facility] 09/09/2023 12:38 AM

## 2023-09-09 NOTE — ED Notes (Signed)
CBG=159  

## 2023-09-09 NOTE — ED Notes (Signed)
 This nurse assumed care of pt at this time. Pt back in bed after using bedside commode. Reconnected to VS monitor. Family at bedside. Denies any needs at this time.

## 2023-09-09 NOTE — Progress Notes (Signed)
 PHARMACY - ANTICOAGULATION CONSULT NOTE  Pharmacy Consult for heparin  infusion Indication: chest pain/ACS  No Known Allergies  Patient Measurements: Height: 5' (152.4 cm) Weight: 64 kg (141 lb) IBW/kg (Calculated) : 45.5 HEPARIN  DW (KG): 59  Vital Signs: Temp: 97.6 F (36.4 C) (05/04 0515) Temp Source: Oral (05/04 0515) BP: 121/90 (05/04 0900) Pulse Rate: 53 (05/04 0900)  Labs: Recent Labs    09/08/23 1215 09/08/23 1433 09/08/23 1757 09/08/23 2331 09/09/23 0520 09/09/23 1003  HGB 13.3  --   --   --  13.0  --   HCT 38.5  --   --   --  37.8  --   PLT 278  --   --   --  264  --   APTT  --  33  --   --   --   --   LABPROT  --  14.4  --   --   --   --   INR  --  1.1  --   --   --   --   HEPARINUNFRC  --   --   --  0.19*  --  0.42  CREATININE 0.63  --   --   --  0.60  --   CKTOTAL 162  --   --   --   --   --   TROPONINIHS 163* 159* 192* 203*  --   --     Estimated Creatinine Clearance: 53.9 mL/min (by C-G formula based on SCr of 0.6 mg/dL).   Medical History: Past Medical History:  Diagnosis Date   Anemia 02/16/2020   Arthritis    KNEE, hands   Diabetes mellitus without complication (HCC)    Difficult intubation    Gall stones 10/31/2016   GERD (gastroesophageal reflux disease)    NO MEDS   Hyperlipidemia    Hypertension    Hypothyroidism    Presence of dental prosthetic device    Dental implants,  top - front 2 teeth   Thyroid  disease    Weight decrease 02/16/2020   Weight loss 02/16/2020    Assessment: 73 y.o. female past medical history significant for diabetes, hypertension, who presents to the emergency department with chest pain.  A review of medical records reveals no chronic anticoagulation prior to arrival  Baseline Labs: pending  Goal of Therapy:  Heparin  level 0.3-0.7 units/ml Monitor platelets by anticoagulation protocol: Yes  05/03 2331 HL 0.19, subtherapeutic 5/4 1003 HL 0.42   therapeutic x1     Plan:  5/4 1003 HL 0.42    therapeutic x1  Continue heparin  infusion at 950 units/hr check confirmatory HL in 8 hrs CBC daily while on heparin   Thomasine Flick PharmD Clinical Pharmacist 09/09/2023

## 2023-09-09 NOTE — ED Notes (Signed)
 This RN gave report to [facility] and performed care handoff. Call light in reach, bed wheels locked, side rails raised, pt updated on plan of care. Rounding completed.

## 2023-09-09 NOTE — ED Notes (Signed)
 Writer assisted pt to bathroom at this time to bedside commode.

## 2023-09-09 NOTE — ED Notes (Signed)
 CC:

## 2023-09-09 NOTE — Progress Notes (Signed)
 [facility] Progress Note  Patient: Sheena Parker    YWV:[id number]  DOA: 09/08/2023     Date of Service: the patient was seen and examined on 09/09/2023  Chief Complaint  Patient presents with   Chest Pain   Brief hospital course: Sheena Parker is a 73 y.o. female with PMH of HTN, NIDDM T2, peripheral neuropathy, hypothyroid, carotid stenosis s/p left ICA stent 6/21,/significant stenosis on the right carotid artery, workup pending as per vascular surgery, presented at [facility] ED with complaining of chest pain.  As per patient she was walking around at home with little exertion to get work done at home and suddenly she felt left-sided chest pain, pressure and lasted for 15 minutes.  Pain resolved with rest and drinking water.  Patient also felt sweating.  Did not take any medication at home.  In the ED patient was chest pain-free.    Assessment and Plan:  # NSTEMI (non-ST elevated myocardial infarction) (HCC) Chest pain, most likely due to hypertensive emergency Troponin elevated most likely demand ischemia Troponin peaked 203 remained flat Chest pain resolved Continue aspirin  81 mg p.o. daily, nitroglycerin as needed Continue heparin  IV infusion  Continue to monitor on telemetry Follow 2D echocardiogram Cardiology consult appreciated, recommended cardiac cath which will be done tomorrow a.m.  Patient agreed with the plan     # Hypertensive emergency Resumed home meds amlodipine  5 mg p.o. daily and losartan  50 mg p.o. daily 5/4 increased amlodipine  from 5 to 10 mg p.o. daily IV hydralazine  as needed Monitor BP and titrate medications accordingly   # Carotid stenosis S/p left ICA stent in 10/2019 Right ICA 60 to 79% stenosis, patient will need CTA neck and follow-up as an outpatient with vascular surgery     # Hypothyroidism Continued home dose Synthroid  50 mcg p.o. daily TSH 5.9 elevated, recommend to follow with PCP to titrate dose accordingly   # NIDDM T2, with peripheral  neuropathy Hemoglobin A1c 9.9, elevated, poorly controlled diabetes Held metformin  for now Resumed gabapentin  home dose Started NovoLog sliding scale Monitor CBG, continue diabetic diet    Body mass index is 27.54 kg/m.  Interventions:  Diet: Carb modified diet DVT Prophylaxis: Therapeutic Anticoagulation with hep gtt    Advance goals of care discussion: Full code  Family Communication: family was not present at bedside, at the time of interview.  The pt provided permission to discuss medical plan with the family. Opportunity was given to ask question and all questions were answered satisfactorily.   Disposition:  Pt is from home, admitted with Chest pain, needs Cath in am, which precludes a safe discharge. Discharge to home, when stable and cleared by cards.  Subjective: No significant events overnight, denies any chest pain or palpitation, no any active issues, resting comfortably. Agreed for cardiac cath which will be done tomorrow a.m.  Physical Exam: General: NAD, lying comfortably Appear in no distress, affect appropriate Eyes: PERRLA ENT: Oral Mucosa Clear, moist  Neck: no JVD,  Cardiovascular: S1 and S2 Present, no Murmur,  Respiratory: good respiratory effort, Bilateral Air entry equal and Decreased, no Crackles, no wheezes Abdomen: Bowel Sound present, Soft and no tenderness,  Skin: no rashes Extremities: no Pedal edema, no calf tenderness Neurologic: without any new focal findings Gait not checked due to patient safety concerns  Vitals:   09/09/23 1240 09/09/23 1250 09/09/23 1300 09/09/23 1310  BP:   (!) 133/52   Pulse: (!) 57 61 (!) 54 (!) 53  Resp: 14 19 16  18  Temp:      TempSrc:      SpO2: 98% 100% 100% 100%  Weight:      Height:        Intake/Output Summary (Last 24 hours) at 09/09/2023 1426 Last data filed at 09/08/2023 1442 Gross per 24 hour  Intake 500 ml  Output --  Net 500 ml   Filed Weights   09/08/23 1210  Weight: 64 kg    Data  Reviewed: I have personally reviewed and interpreted daily labs, tele strips, imagings as discussed above. I reviewed all nursing notes, pharmacy notes, vitals, pertinent old records I have discussed plan of care as described above with RN and patient/family.  CBC: Recent Labs  Lab 09/08/23 1215 09/09/23 0520  WBC 7.7 9.3  HGB 13.3 13.0  HCT 38.5 37.8  MCV 83.0 82.9  PLT 278 264   Basic Metabolic Panel: Recent Labs  Lab 09/08/23 1215 09/08/23 1433 09/09/23 0520  NA 129*  --  132*  K 4.1  --  4.1  CL 97*  --  99  CO2 22  --  24  GLUCOSE 190*  --  167*  BUN 16  --  16  CREATININE 0.63  --  0.60  CALCIUM  9.1  --  9.1  MG  --  1.8 1.9  PHOS  --  3.7 4.3    Studies: No results found.  Scheduled Meds:  amLODipine   10 mg Oral Daily   aspirin  EC  81 mg Oral Daily   atorvastatin   20 mg Oral QHS   gabapentin   100 mg Oral TID   insulin aspart  0-15 Units Subcutaneous TID WC   levothyroxine   50 mcg Oral QAC breakfast   losartan   50 mg Oral Daily   sodium chloride  flush  3 mL Intravenous Q12H   sodium chloride  flush  3 mL Intravenous Q12H   Continuous Infusions:  heparin  950 Units/hr (09/09/23 0042)   PRN Meds: acetaminophen  **OR** acetaminophen , hydrALAZINE , morphine  injection, nitroGLYCERIN, ondansetron  **OR** ondansetron  (ZOFRAN ) IV, sodium chloride  flush  Time spent: 35 minutes  Author: Althia Parker. MD [facility] 09/09/2023 2:26 PM  To reach On-call, see care teams to locate the attending and reach out to them via [email or web address]. If 7PM-7AM, please contact night-coverage If you still have difficulty reaching the attending provider, please page the [facility] (Director on Call) for [facility] on amion for assistance.

## 2023-09-09 NOTE — ED Notes (Signed)
 Pts heart rate substaining in the 40s per cardiac monitor. Provider notified.

## 2023-09-09 NOTE — ED Notes (Signed)
 This RN received report from Rolm Clos RN and performed care handoff. This RN introduced self to pt. Call light in reach, bed wheels locked, side rails raised, pt updated on plan of care. Rounding completed. High fall risk precautions in place.

## 2023-09-09 NOTE — Progress Notes (Signed)
 [facility] Cardiology  SUBJECTIVE: Patient laying in bed, denies chest pain   Vitals:   09/09/23 0530 09/09/23 0700 09/09/23 0730 09/09/23 0830  BP: (!) 162/60 (!) 151/68 (!) 157/55 135/61  Pulse: (!) 50 (!) 46 (!) 45 (!) 55  Resp: 14 13 13 12   Temp:      TempSrc:      SpO2: 100% 100% 100% 100%  Weight:      Height:         Intake/Output Summary (Last 24 hours) at 09/09/2023 0940 Last data filed at 09/08/2023 1442 Gross per 24 hour  Intake 500 ml  Output --  Net 500 ml      PHYSICAL EXAM  General: Well developed, well nourished, in no acute distress HEENT:  Normocephalic and atramatic Neck:  No JVD.  Lungs: Clear bilaterally to auscultation and percussion. Heart: HRRR . Normal S1 and S2 without gallops or murmurs.  Abdomen: Bowel sounds are positive, abdomen soft and non-tender  Msk:  Back normal, normal gait. Normal strength and tone for age. Extremities: No clubbing, cyanosis or edema.   Neuro: Alert and oriented X 3. Psych:  Good affect, responds appropriately   LABS: Basic Metabolic Panel: Recent Labs    09/08/23 1215 09/08/23 1433 09/09/23 0520  NA 129*  --  132*  K 4.1  --  4.1  CL 97*  --  99  CO2 22  --  24  GLUCOSE 190*  --  167*  BUN 16  --  16  CREATININE 0.63  --  0.60  CALCIUM  9.1  --  9.1  MG  --  1.8 1.9  PHOS  --  3.7 4.3   Liver Function Tests: No results for input(s): "AST", "ALT", "ALKPHOS", "BILITOT", "PROT", "ALBUMIN" in the last 72 hours. No results for input(s): "LIPASE", "AMYLASE" in the last 72 hours. CBC: Recent Labs    09/08/23 1215 09/09/23 0520  WBC 7.7 9.3  HGB 13.3 13.0  HCT 38.5 37.8  MCV 83.0 82.9  PLT 278 264   Cardiac Enzymes: Recent Labs    09/08/23 1215  CKTOTAL 162   BNP: Invalid input(s): "POCBNP" D-Dimer: No results for input(s): "DDIMER" in the last 72 hours. Hemoglobin A1C: Recent Labs    09/08/23 1215  HGBA1C 9.9*   Fasting Lipid Panel: Recent Labs    09/08/23 1433  CHOL 88  HDL 31*  LDLCALC  40  TRIG 85  CHOLHDL 2.8   Thyroid  Function Tests: Recent Labs    09/08/23 1433  TSH 6.907*   Anemia Panel: No results for input(s): "VITAMINB12", "FOLATE", "FERRITIN", "TIBC", "IRON", "RETICCTPCT" in the last 72 hours.  DG Chest 2 View Result Date: 09/08/2023 CLINICAL DATA:  Chest pain EXAM: CHEST - 2 VIEW COMPARISON:  None Available. FINDINGS: Lungs are clear.  No pneumothorax. Heart size and mediastinal contours are within normal limits. Aortic Atherosclerosis (ICD10-170.0). No effusion. Visualized bones unremarkable.  Left carotid stent. IMPRESSION: No acute cardiopulmonary disease. Electronically Signed   By: Nicoletta Barrier M.D.   On: 09/08/2023 12:35     Echo pending  TELEMETRY: Sinus bradycardia 56 bpm:  ASSESSMENT AND PLAN:  Principal Problem:   NSTEMI (non-ST elevated myocardial infarction) (HCC)    1.  NSTEMI (163, 159, 192, 202), no recurrent chest pain on heparin  drip 2.  Essential hypertension 3.  Hyperlipidemia 4.  Bilateral carotid stents  Recommendations  1.  Agree with current therapy 2.  Continue heparin  drip 3.  Proceed with left heart cardiac catheterization.  The risk, benefits alternatives of cardiac catheterization and possible PCI were explained to the patient and informed consent was obtained.   Percival Brace, MD, PhD, [facility] 09/09/2023 9:40 AM

## 2023-09-09 NOTE — Progress Notes (Signed)
 PHARMACY - ANTICOAGULATION CONSULT NOTE  Pharmacy Consult for heparin  infusion Indication: chest pain/ACS  No Known Allergies  Patient Measurements: Height: 5' (152.4 cm) Weight: 64 kg (141 lb) IBW/kg (Calculated) : 45.5 HEPARIN  DW (KG): 59  Vital Signs: Temp: 97.8 F (36.6 C) (05/04 1645) Temp Source: Oral (05/04 1645) BP: 126/47 (05/04 1700) Pulse Rate: 51 (05/04 1710)  Labs: Recent Labs    09/08/23 1215 09/08/23 1433 09/08/23 1757 09/08/23 2331 09/09/23 0520 09/09/23 1003 09/09/23 1808  HGB 13.3  --   --   --  13.0  --   --   HCT 38.5  --   --   --  37.8  --   --   PLT 278  --   --   --  264  --   --   APTT  --  33  --   --   --   --   --   LABPROT  --  14.4  --   --   --   --   --   INR  --  1.1  --   --   --   --   --   HEPARINUNFRC  --   --   --  0.19*  --  0.42 0.42  CREATININE 0.63  --   --   --  0.60  --   --   CKTOTAL 162  --   --   --   --   --   --   TROPONINIHS 163* 159* 192* 203*  --   --   --     Estimated Creatinine Clearance: 53.9 mL/min (by C-G formula based on SCr of 0.6 mg/dL).   Medical History: Past Medical History:  Diagnosis Date   Anemia 02/16/2020   Arthritis    KNEE, hands   Diabetes mellitus without complication (HCC)    Difficult intubation    Gall stones 10/31/2016   GERD (gastroesophageal reflux disease)    NO MEDS   Hyperlipidemia    Hypertension    Hypothyroidism    Presence of dental prosthetic device    Dental implants,  top - front 2 teeth   Thyroid  disease    Weight decrease 02/16/2020   Weight loss 02/16/2020    Assessment: 73 y.o. female past medical history significant for diabetes, hypertension, who presents to the emergency department with chest pain.  A review of medical records reveals no chronic anticoagulation prior to arrival  Baseline Labs: pending  Goal of Therapy:  Heparin  level 0.3-0.7 units/ml Monitor platelets by anticoagulation protocol: Yes  05/03 2331 HL 0.19, subtherapeutic 5/4 1003 HL  0.42   therapeutic x1  5/4 1808 HL 0.42,   therapeutic x2   Plan:  Continue heparin  infusion at 950 units/hr Check HL in AM CBC daily while on heparin   Thank you for involving pharmacy in this patient's care.   Ananias Balls, PharmD Clinical Pharmacist 09/09/2023 6:29 PM

## 2023-09-10 ENCOUNTER — Other Ambulatory Visit: Payer: Self-pay

## 2023-09-10 ENCOUNTER — Observation Stay
Admission: EM | Admit: 2023-09-10 | Discharge: 2023-09-11 | Disposition: A | Discharge status: Home / Self Care | Attending: Internal Medicine | Admitting: Internal Medicine

## 2023-09-10 ENCOUNTER — Encounter
Admission: EM | Disposition: A | Discharge status: Home / Self Care | Payer: Self-pay | Source: Home / Self Care | Attending: Student

## 2023-09-10 ENCOUNTER — Encounter: Payer: Self-pay | Admitting: Student

## 2023-09-10 DIAGNOSIS — Z7982 Long term (current) use of aspirin: Secondary | ICD-10-CM | POA: Diagnosis not present

## 2023-09-10 DIAGNOSIS — I2510 Atherosclerotic heart disease of native coronary artery without angina pectoris: Secondary | ICD-10-CM

## 2023-09-10 DIAGNOSIS — Z79899 Other long term (current) drug therapy: Secondary | ICD-10-CM | POA: Insufficient documentation

## 2023-09-10 DIAGNOSIS — E871 Hypo-osmolality and hyponatremia: Secondary | ICD-10-CM | POA: Diagnosis not present

## 2023-09-10 DIAGNOSIS — E1165 Type 2 diabetes mellitus with hyperglycemia: Secondary | ICD-10-CM | POA: Diagnosis not present

## 2023-09-10 DIAGNOSIS — R55 Syncope and collapse: Secondary | ICD-10-CM | POA: Diagnosis not present

## 2023-09-10 DIAGNOSIS — E1140 Type 2 diabetes mellitus with diabetic neuropathy, unspecified: Secondary | ICD-10-CM | POA: Diagnosis not present

## 2023-09-10 DIAGNOSIS — E039 Hypothyroidism, unspecified: Secondary | ICD-10-CM | POA: Diagnosis not present

## 2023-09-10 DIAGNOSIS — I10 Essential (primary) hypertension: Secondary | ICD-10-CM | POA: Diagnosis not present

## 2023-09-10 DIAGNOSIS — Z9861 Coronary angioplasty status: Secondary | ICD-10-CM | POA: Diagnosis not present

## 2023-09-10 DIAGNOSIS — Z7984 Long term (current) use of oral hypoglycemic drugs: Secondary | ICD-10-CM | POA: Insufficient documentation

## 2023-09-10 DIAGNOSIS — E119 Type 2 diabetes mellitus without complications: Secondary | ICD-10-CM

## 2023-09-10 HISTORY — PX: LEFT HEART CATH AND CORONARY ANGIOGRAPHY: CATH118249

## 2023-09-10 HISTORY — PX: CORONARY STENT INTERVENTION: CATH118234

## 2023-09-10 LAB — CBC
HCT: 37.5 % (ref 36.0–46.0)
HCT: 39.2 % (ref 36.0–46.0)
Hemoglobin: 13 g/dL (ref 12.0–15.0)
Hemoglobin: 13.4 g/dL (ref 12.0–15.0)
MCH: 29 pg (ref 26.0–34.0)
MCH: 28.8 pg (ref 26.0–34.0)
MCHC: 34.7 g/dL (ref 30.0–36.0)
MCHC: 34.2 g/dL (ref 30.0–36.0)
MCV: 83.5 fL (ref 80.0–100.0)
MCV: 84.1 fL (ref 80.0–100.0)
Platelets: 252 10*3/uL (ref 150–400)
Platelets: 287 10*3/uL (ref 150–400)
RBC: 4.49 MIL/uL (ref 3.87–5.11)
RBC: 4.66 MIL/uL (ref 3.87–5.11)
RDW: 11.9 % (ref 11.5–15.5)
RDW: 11.9 % (ref 11.5–15.5)
WBC: 7.3 10*3/uL (ref 4.0–10.5)
WBC: 8.3 10*3/uL (ref 4.0–10.5)
nRBC: 0 % (ref 0.0–0.2)
nRBC: 0 % (ref 0.0–0.2)

## 2023-09-10 LAB — BASIC METABOLIC PANEL WITH GFR
Anion gap: 11 (ref 5–15)
BUN: 16 mg/dL (ref 8–23)
CO2: 24 mmol/L (ref 22–32)
Calcium: 8.8 mg/dL — ABNORMAL LOW (ref 8.9–10.3)
Chloride: 96 mmol/L — ABNORMAL LOW (ref 98–111)
Creatinine, Ser: 0.58 mg/dL (ref 0.44–1.00)
GFR, Estimated: >60 mL/min (ref >60)
Glucose, Bld: 164 mg/dL — ABNORMAL HIGH (ref 70–99)
Potassium: 3.9 mmol/L (ref 3.5–5.1)
Sodium: 131 mmol/L — ABNORMAL LOW (ref 135–145)

## 2023-09-10 LAB — COMPREHENSIVE METABOLIC PANEL WITH GFR
ALT: 27 U/L (ref 0–44)
AST: 26 U/L (ref 15–41)
Albumin: 3.7 g/dL (ref 3.5–5.0)
Alkaline Phosphatase: 50 U/L (ref 38–126)
Anion gap: 13 (ref 5–15)
BUN: 13 mg/dL (ref 8–23)
CO2: 21 mmol/L — ABNORMAL LOW (ref 22–32)
Calcium: 8.8 mg/dL — ABNORMAL LOW (ref 8.9–10.3)
Chloride: 96 mmol/L — ABNORMAL LOW (ref 98–111)
Creatinine, Ser: 0.65 mg/dL (ref 0.44–1.00)
GFR, Estimated: >60 mL/min (ref >60)
Glucose, Bld: 193 mg/dL — ABNORMAL HIGH (ref 70–99)
Potassium: 3.8 mmol/L (ref 3.5–5.1)
Sodium: 130 mmol/L — ABNORMAL LOW (ref 135–145)
Total Bilirubin: 0.9 mg/dL (ref 0.0–1.2)
Total Protein: 7 g/dL (ref 6.5–8.1)

## 2023-09-10 LAB — TROPONIN I (HIGH SENSITIVITY)
Troponin I (High Sensitivity): 84 ng/L — ABNORMAL HIGH (ref <18)
Troponin I (High Sensitivity): 108 ng/L (ref <18)

## 2023-09-10 LAB — ECHOCARDIOGRAM COMPLETE
AR max vel: 2.46 cm2
AV Peak grad: 13.3 mmHg
Ao pk vel: 1.83 m/s
Area-P 1/2: 2.3 cm2
Height: 60 in
S' Lateral: 2.5 cm
Weight: 2256 [oz_av]

## 2023-09-10 LAB — GLUCOSE, CAPILLARY
Glucose-Capillary: 180 mg/dL — ABNORMAL HIGH (ref 70–99)
Glucose-Capillary: 191 mg/dL — ABNORMAL HIGH (ref 70–99)

## 2023-09-10 LAB — POCT ACTIVATED CLOTTING TIME
Activated Clotting Time: 354 s
Activated Clotting Time: 567 s

## 2023-09-10 LAB — PHOSPHORUS: Phosphorus: 4 mg/dL (ref 2.5–4.6)

## 2023-09-10 LAB — HEPARIN LEVEL (UNFRACTIONATED): Heparin Unfractionated: 0.56 [IU]/mL (ref 0.30–0.70)

## 2023-09-10 LAB — MAGNESIUM: Magnesium: 2 mg/dL (ref 1.7–2.4)

## 2023-09-10 LAB — CBG MONITORING, ED
Glucose-Capillary: 259 mg/dL — ABNORMAL HIGH (ref 70–99)
Glucose-Capillary: 232 mg/dL — ABNORMAL HIGH (ref 70–99)

## 2023-09-10 SURGERY — LEFT HEART CATH AND CORONARY ANGIOGRAPHY
Anesthesia: Moderate Sedation

## 2023-09-10 MED ORDER — PRASUGREL HCL 10 MG PO TABS
10.0000 mg | ORAL_TABLET | Freq: Every day | ORAL | 11 refills | Status: AC
  Filled 2023-09-10: qty 30, 30d supply, fill #0

## 2023-09-10 MED ORDER — LABETALOL HCL 5 MG/ML IV SOLN: 10.0000 mg | INTRAVENOUS | Status: DC | PRN

## 2023-09-10 MED ORDER — LEVOTHYROXINE SODIUM 50 MCG PO TABS: 50.0000 ug | ORAL_TABLET | Freq: Every day | ORAL | Status: DC

## 2023-09-10 MED ORDER — ENOXAPARIN SODIUM 40 MG/0.4ML IJ SOSY
40.0000 mg | PREFILLED_SYRINGE | INTRAMUSCULAR | Status: DC
  Administered 2023-09-10: 40 mg via SUBCUTANEOUS
  Filled 2023-09-10: qty 0.4

## 2023-09-10 MED ORDER — SODIUM CHLORIDE 0.9 % WEIGHT BASED INFUSION
3.0000 mL/kg/h | INTRAVENOUS | Status: AC
  Administered 2023-09-10: 3 mL/kg/h via INTRAVENOUS

## 2023-09-10 MED ORDER — HEPARIN (PORCINE) IN NACL 1000-0.9 UT/500ML-% IV SOLN
INTRAVENOUS | Status: DC | PRN
Start: 2023-09-10 — End: 2023-09-10
  Administered 2023-09-10: 1000 mL

## 2023-09-10 MED ORDER — ATORVASTATIN CALCIUM 80 MG PO TABS
80.0000 mg | ORAL_TABLET | Freq: Every day | ORAL | Status: DC
  Filled 2023-09-10: qty 1

## 2023-09-10 MED ORDER — HEPARIN SODIUM (PORCINE) 1000 UNIT/ML IJ SOLN
INTRAMUSCULAR | Status: AC
Start: 2023-09-10 — End: ?
  Filled 2023-09-10: qty 10

## 2023-09-10 MED ORDER — ASPIRIN 81 MG PO CHEW
CHEWABLE_TABLET | ORAL | Status: DC | PRN
  Administered 2023-09-10: 243 mg via ORAL

## 2023-09-10 MED ORDER — SODIUM CHLORIDE 0.9 % WEIGHT BASED INFUSION
1.0000 mL/kg/h | INTRAVENOUS | Status: DC
Start: 2023-09-10 — End: 2023-09-10

## 2023-09-10 MED ORDER — ACETAMINOPHEN 650 MG RE SUPP: 650.0000 mg | Freq: Four times a day (QID) | RECTAL | Status: DC | PRN

## 2023-09-10 MED ORDER — ASPIRIN 81 MG PO CHEW
81.0000 mg | CHEWABLE_TABLET | Freq: Every day | ORAL | Status: DC
  Administered 2023-09-11: 81 mg via ORAL
  Filled 2023-09-10: qty 1

## 2023-09-10 MED ORDER — ATORVASTATIN CALCIUM 80 MG PO TABS
80.0000 mg | ORAL_TABLET | Freq: Every day | ORAL | 11 refills | Status: AC
  Filled 2023-09-10: qty 30, 30d supply, fill #0

## 2023-09-10 MED ORDER — ASPIRIN 81 MG PO CHEW: 81.0000 mg | CHEWABLE_TABLET | ORAL | Status: AC

## 2023-09-10 MED ORDER — ACETAMINOPHEN 325 MG PO TABS: 650.0000 mg | ORAL_TABLET | Freq: Four times a day (QID) | ORAL | Status: DC | PRN

## 2023-09-10 MED ORDER — ONDANSETRON HCL 4 MG/2ML IJ SOLN: 4.0000 mg | Freq: Four times a day (QID) | INTRAMUSCULAR | Status: DC | PRN

## 2023-09-10 MED ORDER — ASPIRIN 81 MG PO CHEW: 81.0000 mg | CHEWABLE_TABLET | Freq: Every day | ORAL | Status: DC

## 2023-09-10 MED ORDER — PRASUGREL HCL 10 MG PO TABS
10.0000 mg | ORAL_TABLET | Freq: Every day | ORAL | Status: DC
  Administered 2023-09-11: 10 mg via ORAL
  Filled 2023-09-10: qty 1

## 2023-09-10 MED ORDER — GABAPENTIN 100 MG PO CAPS
100.0000 mg | ORAL_CAPSULE | Freq: Three times a day (TID) | ORAL | Status: DC
  Administered 2023-09-10 – 2023-09-11 (×3): 100 mg via ORAL
  Filled 2023-09-10 (×3): qty 1

## 2023-09-10 MED ORDER — IOHEXOL 300 MG/ML  SOLN
INTRAMUSCULAR | Status: DC | PRN
  Administered 2023-09-10: 144 mL

## 2023-09-10 MED ORDER — FENTANYL CITRATE (PF) 100 MCG/2ML IJ SOLN
INTRAMUSCULAR | Status: DC | PRN
  Administered 2023-09-10: 12.5 ug via INTRAVENOUS

## 2023-09-10 MED ORDER — SODIUM CHLORIDE 0.9 % WEIGHT BASED INFUSION: 1.0000 mL/kg/h | INTRAVENOUS | Status: DC

## 2023-09-10 MED ORDER — SODIUM CHLORIDE 0.9 % IV SOLN: 250.0000 mL | INTRAVENOUS | Status: DC | PRN

## 2023-09-10 MED ORDER — INSULIN ASPART 100 UNIT/ML IJ SOLN
0.0000 [IU] | Freq: Three times a day (TID) | INTRAMUSCULAR | Status: DC
  Administered 2023-09-10: 8 [IU] via SUBCUTANEOUS
  Administered 2023-09-11 (×2): 3 [IU] via SUBCUTANEOUS
  Administered 2023-09-11: 5 [IU] via SUBCUTANEOUS
  Filled 2023-09-10 (×4): qty 1

## 2023-09-10 MED ORDER — PRASUGREL HCL 10 MG PO TABS
ORAL_TABLET | ORAL | Status: DC | PRN
  Administered 2023-09-10: 60 mg via ORAL

## 2023-09-10 MED ORDER — SODIUM CHLORIDE 0.9 % IV BOLUS
1000.0000 mL | Freq: Once | INTRAVENOUS | Status: AC
  Administered 2023-09-10: 1000 mL via INTRAVENOUS

## 2023-09-10 MED ORDER — ONDANSETRON HCL 4 MG PO TABS: 4.0000 mg | ORAL_TABLET | Freq: Four times a day (QID) | ORAL | Status: DC | PRN

## 2023-09-10 MED ORDER — ASPIRIN EC 81 MG PO TBEC
81.0000 mg | DELAYED_RELEASE_TABLET | Freq: Every day | ORAL | 11 refills | Status: AC
  Filled 2023-09-10: qty 30, 30d supply, fill #0

## 2023-09-10 MED ORDER — LIDOCAINE HCL (PF) 1 % IJ SOLN
INTRAMUSCULAR | Status: DC | PRN
  Administered 2023-09-10: 2 mL

## 2023-09-10 MED ORDER — NITROGLYCERIN 1 MG/10 ML FOR IR/CATH LAB
INTRA_ARTERIAL | Status: AC
Start: 2023-09-10 — End: ?
  Filled 2023-09-10: qty 10

## 2023-09-10 MED ORDER — LOSARTAN POTASSIUM 50 MG PO TABS
50.0000 mg | ORAL_TABLET | Freq: Every day | ORAL | Status: DC
  Administered 2023-09-11: 50 mg via ORAL
  Filled 2023-09-10: qty 1

## 2023-09-10 MED ORDER — POLYETHYLENE GLYCOL 3350 17 G PO PACK: 17.0000 g | PACK | Freq: Every day | ORAL | Status: DC | PRN

## 2023-09-10 MED ORDER — PRASUGREL HCL 10 MG PO TABS
ORAL_TABLET | ORAL | Status: AC
  Filled 2023-09-10: qty 6

## 2023-09-10 MED ORDER — LIDOCAINE HCL 1 % IJ SOLN
INTRAMUSCULAR | Status: AC
  Filled 2023-09-10: qty 20

## 2023-09-10 MED ORDER — SODIUM CHLORIDE 0.9% FLUSH: 3.0000 mL | INTRAVENOUS | Status: DC | PRN

## 2023-09-10 MED ORDER — MIDAZOLAM HCL 2 MG/2ML IJ SOLN
INTRAMUSCULAR | Status: DC | PRN
Start: 2023-09-10 — End: 2023-09-10
  Administered 2023-09-10: .5 mg via INTRAVENOUS

## 2023-09-10 MED ORDER — VERAPAMIL HCL 2.5 MG/ML IV SOLN
INTRAVENOUS | Status: AC
  Filled 2023-09-10: qty 2

## 2023-09-10 MED ORDER — HEPARIN SODIUM (PORCINE) 1000 UNIT/ML IJ SOLN
INTRAMUSCULAR | Status: DC | PRN
  Administered 2023-09-10: 7000 [IU] via INTRAVENOUS
  Administered 2023-09-10: 3000 [IU] via INTRAVENOUS

## 2023-09-10 MED ORDER — NITROGLYCERIN 1 MG/10 ML FOR IR/CATH LAB
INTRA_ARTERIAL | Status: DC | PRN
  Administered 2023-09-10 (×2): 200 ug via INTRACORONARY

## 2023-09-10 MED ORDER — VERAPAMIL HCL 2.5 MG/ML IV SOLN
INTRAVENOUS | Status: DC | PRN
  Administered 2023-09-10: 2.5 mL via INTRA_ARTERIAL

## 2023-09-10 MED ORDER — HEPARIN (PORCINE) IN NACL 1000-0.9 UT/500ML-% IV SOLN
INTRAVENOUS | Status: AC
  Filled 2023-09-10: qty 1000

## 2023-09-10 MED ORDER — ASPIRIN 81 MG PO CHEW
CHEWABLE_TABLET | ORAL | Status: AC
  Filled 2023-09-10: qty 3

## 2023-09-10 MED ORDER — AMLODIPINE BESYLATE 5 MG PO TABS
10.0000 mg | ORAL_TABLET | Freq: Every day | ORAL | Status: DC
  Administered 2023-09-11: 10 mg via ORAL
  Filled 2023-09-10: qty 2

## 2023-09-10 MED ORDER — PRASUGREL HCL 10 MG PO TABS: 10.0000 mg | ORAL_TABLET | Freq: Every day | ORAL | Status: DC

## 2023-09-10 MED ORDER — FENTANYL CITRATE (PF) 100 MCG/2ML IJ SOLN
INTRAMUSCULAR | Status: AC
Start: 2023-09-10 — End: ?
  Filled 2023-09-10: qty 2

## 2023-09-10 MED ORDER — SODIUM CHLORIDE 0.9% FLUSH
3.0000 mL | Freq: Two times a day (BID) | INTRAVENOUS | Status: DC
  Administered 2023-09-10 – 2023-09-11 (×2): 3 mL via INTRAVENOUS

## 2023-09-10 MED ORDER — MIDAZOLAM HCL 2 MG/2ML IJ SOLN
INTRAMUSCULAR | Status: AC
  Filled 2023-09-10: qty 2

## 2023-09-10 MED ORDER — ATORVASTATIN CALCIUM 20 MG PO TABS
80.0000 mg | ORAL_TABLET | Freq: Every day | ORAL | Status: DC
  Administered 2023-09-10: 80 mg via ORAL
  Filled 2023-09-10: qty 4

## 2023-09-10 MED ORDER — SODIUM CHLORIDE 0.9% FLUSH: 3.0000 mL | Freq: Two times a day (BID) | INTRAVENOUS | Status: DC

## 2023-09-10 SURGICAL SUPPLY — 17 items
BALLOON TREK RX 2.25X12 (BALLOONS) IMPLANT
BALLOON ~~LOC~~ TREK NEO RX 2.75X8 (BALLOONS) IMPLANT
CATH INFINITI 5 FR JL3.5 (CATHETERS) IMPLANT
CATH INFINITI JR4 5F (CATHETERS) IMPLANT
CATH VISTA GUIDE 6FR JR4 SH (CATHETERS) IMPLANT
DEVICE RAD TR BAND REGULAR (VASCULAR PRODUCTS) IMPLANT
DRAPE BRACHIAL (DRAPES) IMPLANT
GLIDESHEATH SLEND SS 6F .021 (SHEATH) IMPLANT
GUIDEWIRE INQWIRE 1.5J.035X260 (WIRE) IMPLANT
KIT ENCORE 26 ADVANTAGE (KITS) IMPLANT
KIT SYRINGE INJ CVI SPIKEX1 (MISCELLANEOUS) IMPLANT
PACK CARDIAC CATH (CUSTOM PROCEDURE TRAY) ×1 IMPLANT
SET ATX-X65L (MISCELLANEOUS) IMPLANT
STATION PROTECTION PRESSURIZED (MISCELLANEOUS) IMPLANT
STENT ONYX FRONTIER 2.5X12 (Permanent Stent) IMPLANT
TUBING CIL FLEX 10 FLL-RA (TUBING) IMPLANT
WIRE ASAHI PROWATER 180CM (WIRE) IMPLANT

## 2023-09-10 NOTE — Progress Notes (Signed)
 Sheena Parker to be D/C'd home per MD order. Discussed with the patient and all questions fully answered by Lus Salter, cath lab RN.  Skin clean, dry and intact without evidence of skin break down, no evidence of skin tears noted. R radial dressing CDI. IV catheter discontinued intact. Site without signs and symptoms of complications. Dressing and pressure applied.  An After Visit Summary was printed and given to the patient.  Patient escorted via WC, and D/C home via private auto.  [name]  09/10/2023

## 2023-09-10 NOTE — ED Notes (Signed)
 Lab called at this time to add on troponin to previous collection. Per lab they will add on the troponin.

## 2023-09-10 NOTE — Plan of Care (Signed)

## 2023-09-10 NOTE — Assessment & Plan Note (Signed)
 Patient has a history of chronic hyponatremia dating back to at least 2021, with baseline around 130.  Currently at baseline.  Given chronicity, likely SIADH.

## 2023-09-10 NOTE — ED Notes (Addendum)
 RN to bedside. Pt back in bed and placed back on monitor at this time.

## 2023-09-10 NOTE — Assessment & Plan Note (Signed)
-   Resume home regimen tomorrow if blood pressure remains stable

## 2023-09-10 NOTE — Assessment & Plan Note (Signed)
 Uncontrolled type 2 diabetes with A1c of 9.9%.  - Hold home regimen - SSI, moderate

## 2023-09-10 NOTE — ED Provider Notes (Signed)
 [facility] Provider Note    Event Date/Time   First MD Initiated Contact with Patient 09/10/23 1647     (approximate)   History   Chief Complaint: Loss of Consciousness   HPI  Sheena Parker is a 73 y.o. female who was brought to the ED due to syncope at home.  She had just been discharged from the hospital this morning after undergoing left heart cath for NSTEMI, having stenting of 90% stenosed RCA lesion, and shortly after arriving home she felt lightheaded.  She was worried it might be due to low blood sugar so she ate lunch, but continued to feel dizzy and passed out.  Family lowered her to the floor, she did not fall, denies any pain.  Denies any preceding chest pain shortness of breath abdominal pain or back pain.  Currently feels fatigued but denies any other symptoms.  Reviewed records from recent hospitalization.  She presented with chest pain, found to have mildly elevated troponin that was slowly uptrending.        Past Medical History:  Diagnosis Date   Anemia 02/16/2020   Arthritis    KNEE, hands   Diabetes mellitus without complication (HCC)    Difficult intubation    Gall stones 10/31/2016   GERD (gastroesophageal reflux disease)    NO MEDS   Hyperlipidemia    Hypertension    Hypothyroidism    Presence of dental prosthetic device    Dental implants,  top - front 2 teeth   Thyroid  disease    Weight decrease 02/16/2020   Weight loss 02/16/2020    Current Outpatient Rx   Order #: [id number] Class: Historical Med   Order #: [id number] Class: Normal   Order #: [id number] Class: Normal   Order #: [id number] Class: Normal   Order #: [id number] Class: Normal   Order #: [id number] Class: Normal   Order #: [id number] Class: Print   Order #: [id number] Class: Normal   Order #: [id number] Class: Normal   Order #: [id number] Class: Normal   [START ON 09/11/2023] Order #: [id number] Class: Normal    Past Surgical History:  Procedure Laterality Date    CAROTID PTA/STENT INTERVENTION Left 10/30/2019   Procedure: CAROTID PTA/STENT INTERVENTION;  Surgeon: Celso College, MD;  Location: [facility];  Service: Cardiovascular;  Laterality: Left;   CATARACT EXTRACTION W/PHACO Right 12/16/2019   Procedure: CATARACT EXTRACTION PHACO AND INTRAOCULAR LENS PLACEMENT (IOC) RIGHT DIABETIC 7.57 00:55.0;  Surgeon: Clair Crews, MD;  Location: [facility];  Service: Ophthalmology;  Laterality: Right;  Diabetic - oral meds   CATARACT EXTRACTION W/PHACO Left 01/06/2020   Procedure: CATARACT EXTRACTION PHACO AND INTRAOCULAR LENS PLACEMENT (IOC) LEFT DIABETIC 7.12  00:44.5;  Surgeon: Clair Crews, MD;  Location: [facility];  Service: Ophthalmology;  Laterality: Left;   CHOLECYSTECTOMY N/A 11/14/2016   Procedure: LAPAROSCOPIC CHOLECYSTECTOMY WITH INTRAOPERATIVE CHOLANGIOGRAM;  Surgeon: Marshall Skeeter, MD;  Location: [facility];  Service: General;  Laterality: N/A;   COLONOSCOPY WITH PROPOFOL  N/A 03/17/2019   Procedure: COLONOSCOPY WITH PROPOFOL ;  Surgeon: Irby Mannan, MD;  Location: [facility];  Service: Endoscopy;  Laterality: N/A;   CORONARY STENT INTERVENTION N/A 09/10/2023   Procedure: CORONARY STENT INTERVENTION;  Surgeon: Percival Brace, MD;  Location: [facility];  Service: Cardiovascular;  Laterality: N/A;   LEFT HEART CATH AND CORONARY ANGIOGRAPHY N/A 09/10/2023   Procedure: LEFT HEART CATH AND CORONARY ANGIOGRAPHY;  Surgeon: Percival Brace, MD;  Location: [facility];  Service: Cardiovascular;  Laterality: N/A;  Physical Exam   Triage Vital Signs: ED Triage Vitals  Encounter Vitals Group     BP 09/10/23 1639 (!) 164/61     Systolic BP Percentile --      Diastolic BP Percentile --      Pulse Rate 09/10/23 1639 (!) 51     Resp 09/10/23 1639 14     Temp 09/10/23 1639 98.1 F (36.7 C)     Temp Source 09/10/23 1639 Oral     SpO2 09/10/23 1639 100 %     Weight 09/10/23 1640 144 lb  (65.3 kg)     Height 09/10/23 1640 5' (1.524 m)     Head Circumference --      Peak Flow --      Pain Score 09/10/23 1640 0     Pain Loc --      Pain Education --      Exclude from Growth Chart --     Most recent vital signs: Vitals:   09/10/23 2054 09/10/23 2100  BP:  (!) 171/59  Pulse:  60  Resp:  20  Temp: 98.2 F (36.8 C)   SpO2:  100%    General: Awake, no distress.  CV:  Good peripheral perfusion.  Regular rate rhythm, normal distal pulses. Resp:  Normal effort.  Clear to auscultation bilaterally Abd:  No distention.  Soft nontender Other:  Cranial nerves III through XII intact.  Moving all extremities.   ED Results / Procedures / Treatments   Labs (all labs ordered are listed, but only abnormal results are displayed) Labs Reviewed  COMPREHENSIVE METABOLIC PANEL WITH GFR - Abnormal; Notable for the following components:      Result Value   Sodium 130 (*)    Chloride 96 (*)    CO2 21 (*)    Glucose, Bld 193 (*)    Calcium  8.8 (*)    All other components within normal limits  CBG MONITORING, ED - Abnormal; Notable for the following components:   Glucose-Capillary 259 (*)    All other components within normal limits  TROPONIN I (HIGH SENSITIVITY) - Abnormal; Notable for the following components:   Troponin I (High Sensitivity) 84 (*)    All other components within normal limits  CBC  TROPONIN I (HIGH SENSITIVITY)     EKG Interpreted by me Sinus bradycardia rate of 51.  Right axis, normal intervals.  Normal QRS ST segments T waves.   RADIOLOGY    PROCEDURES:  Procedures   MEDICATIONS ORDERED IN ED: Medications  insulin aspart (novoLOG) injection 0-15 Units (8 Units Subcutaneous Given 09/10/23 2051)  sodium chloride  flush (NS) 0.9 % injection 3 mL (has no administration in time range)  enoxaparin (LOVENOX) injection 40 mg (has no administration in time range)  acetaminophen  (TYLENOL ) tablet 650 mg (has no administration in time range)    Or   acetaminophen  (TYLENOL ) suppository 650 mg (has no administration in time range)  ondansetron  (ZOFRAN ) tablet 4 mg (has no administration in time range)    Or  ondansetron  (ZOFRAN ) injection 4 mg (has no administration in time range)  polyethylene glycol (MIRALAX / GLYCOLAX) packet 17 g (has no administration in time range)  sodium chloride  0.9 % bolus 1,000 mL (0 mLs Intravenous Stopped 09/10/23 1855)     IMPRESSION / MDM / ASSESSMENT AND PLAN / ED COURSE  I reviewed the triage vital signs and the nursing notes.  DDx: Dehydration, electrolyte derangement, stent thrombosis/NSTEMI, AKI.  Doubt dissection pericardial effusion or PE  Patient's presentation is most consistent with acute presentation with potential threat to life or bodily function.  Patient presents with syncope after returning home from hospital treatment for NSTEMI and left heart cath this morning involving stent of right RCA.  Was lightheaded but no pain before hand, denies any pain afterward or currently.  No trauma.  Recent echocardiogram shows EF 70%, no wall motion abnormalities.  Will give IV fluids, check labs.   Clinical Course as of 09/10/23 2122  Mon Sep 10, 2023  1723 D/w cardiology team Dr. Bob Burn who recommends usual workup for now, but would plan for overnight observation in hospital since she just had LHC to ensure to significant arrhythmias [PS]    Clinical Course User Index [PS] Jacquie Maudlin, MD    ----------------------------------------- 9:21 PM on 09/10/2023 ----------------------------------------- Case discussed with hospitalist   FINAL CLINICAL IMPRESSION(S) / ED DIAGNOSES   Final diagnoses:  Syncope, unspecified syncope type     Rx / DC Orders   ED Discharge Orders     None        Note:  This document was prepared using Dragon voice recognition software and may include unintentional dictation errors.   Jacquie Maudlin, MD 09/10/23 2122

## 2023-09-10 NOTE — H&P (Signed)
 History and Physical    PatientJeann Ince Parker:[id number] DOB: 21-Jan-1951 DOA: 09/10/2023 DOS: the patient was seen and examined on 09/10/2023 PCP: Theron Flavin, MD  Patient coming from: Home  Chief Complaint:  Chief Complaint  Patient presents with   Loss of Consciousness   HPI: Sheena Parker is a 73 y.o. female with medical history significant of NSTEMI s/p PCI with DES to RCA (earlier today), type 2 diabetes, hypertension, hypothyroidism, peripheral neuropathy, carotid artery stenosis s/p stent (June 2021), who presents to the ED due to syncope.  History obtained from both patient and her daughter at bedside.  Sheena Parker states that since Sheena Parker was discharged, Sheena Parker was experiencing dizziness.  Then when Sheena Parker arrived home, Sheena Parker was sitting at the table and does not recall what happened.  Her daughter at bedside states that it appeared Sheena Parker bent over to grab something on the ground but just kept falling slowly to the ground.  Sheena Parker is not sure if Sheena Parker completely lost consciousness or not.  Sheena Parker did not hit the ground hard as family was there to help her.  At this time, her dizziness is improving.  Earlier today and now, patient has not experienced any chest pain, shortness of breath, palpitations.  Sheena Parker does endorse generalized weakness that is all over with no focalizing.  Per chart review, patient was admitted on 10/05/2023 for left-sided chest pain with elevated troponin consistent with NSTEMI.  Sheena Parker was started on heparin  infusion and underwent LHC today with DES to RCA.  Given elevated blood pressure, concern for hypertensive urgency/emergency as well with improvement in blood pressure prior to discharge.  ED course: On arrival to the ED, patient was hypertensive at 142/52 with heart rate of 51.  Sheena Parker was saturating at 100% on room air.  Sheena Parker was afebrile at 98.1.  Initial workup notable for unremarkable CBC, sodium 130, bicarb 21, glucose 193, GFR above 60.  Troponin 84.  Cardiology was  consulted and recommended admission for observation.  [facility] contacted for admission.  Review of Systems: As mentioned in the history of present illness. All other systems reviewed and are negative.  Past Medical History:  Diagnosis Date   Anemia 02/16/2020   Arthritis    KNEE, hands   Diabetes mellitus without complication (HCC)    Difficult intubation    Gall stones 10/31/2016   GERD (gastroesophageal reflux disease)    NO MEDS   Hyperlipidemia    Hypertension    Hypothyroidism    Presence of dental prosthetic device    Dental implants,  top - front 2 teeth   Thyroid  disease    Weight decrease 02/16/2020   Weight loss 02/16/2020   Past Surgical History:  Procedure Laterality Date   CAROTID PTA/STENT INTERVENTION Left 10/30/2019   Procedure: CAROTID PTA/STENT INTERVENTION;  Surgeon: Celso College, MD;  Location: [facility];  Service: Cardiovascular;  Laterality: Left;   CATARACT EXTRACTION W/PHACO Right 12/16/2019   Procedure: CATARACT EXTRACTION PHACO AND INTRAOCULAR LENS PLACEMENT (IOC) RIGHT DIABETIC 7.57 00:55.0;  Surgeon: Clair Crews, MD;  Location: [facility];  Service: Ophthalmology;  Laterality: Right;  Diabetic - oral meds   CATARACT EXTRACTION W/PHACO Left 01/06/2020   Procedure: CATARACT EXTRACTION PHACO AND INTRAOCULAR LENS PLACEMENT (IOC) LEFT DIABETIC 7.12  00:44.5;  Surgeon: Clair Crews, MD;  Location: [facility];  Service: Ophthalmology;  Laterality: Left;   CHOLECYSTECTOMY N/A 11/14/2016   Procedure: LAPAROSCOPIC CHOLECYSTECTOMY WITH INTRAOPERATIVE CHOLANGIOGRAM;  Surgeon: Marshall Skeeter, MD;  Location: [facility];  Service: General;  Laterality: N/A;   COLONOSCOPY WITH PROPOFOL  N/A 03/17/2019   Procedure: COLONOSCOPY WITH PROPOFOL ;  Surgeon: Irby Mannan, MD;  Location: [facility];  Service: Endoscopy;  Laterality: N/A;   CORONARY STENT INTERVENTION N/A 09/10/2023   Procedure: CORONARY STENT INTERVENTION;  Surgeon:  Percival Brace, MD;  Location: [facility];  Service: Cardiovascular;  Laterality: N/A;   LEFT HEART CATH AND CORONARY ANGIOGRAPHY N/A 09/10/2023   Procedure: LEFT HEART CATH AND CORONARY ANGIOGRAPHY;  Surgeon: Percival Brace, MD;  Location: [facility];  Service: Cardiovascular;  Laterality: N/A;   Social History:  reports that Sheena Parker has never smoked. Sheena Parker has never used smokeless tobacco. Sheena Parker reports that Sheena Parker does not drink alcohol and does not use drugs.  No Known Allergies  Family History  Problem Relation Age of Onset   Breast cancer Mother 51   Breast cancer Sister 51   Breast cancer Sister 43   Breast cancer Sister 50   Colon cancer Neg Hx     Prior to Admission medications   Medication Sig Start Date End Date Taking? Authorizing Provider  acetaminophen  (TYLENOL ) 325 MG tablet Take 650 mg by mouth every 6 (six) hours as needed for mild pain or headache.     [provider]  amLODipine  (NORVASC ) 5 MG tablet Take 1 tablet (5 mg total) by mouth daily. 12/06/21   Theron Flavin, MD  aspirin  EC 81 MG tablet Take 1 tablet (81 mg total) by mouth daily. 09/10/23 09/09/24  Althia Atlas, MD  atorvastatin  (LIPITOR) 80 MG tablet Take 1 tablet (80 mg total) by mouth at bedtime. 09/10/23 09/09/24  Althia Atlas, MD  Cholecalciferol (VITAMIN D ) 125 MCG (5000 UT) CAPS 1  Capsule daily 12/08/19   Theron Flavin, MD  gabapentin  (NEURONTIN ) 100 MG capsule Take 1 capsule (100 mg total) by mouth 3 (three) times daily. 12/06/21   Theron Flavin, MD  glucose blood test strip DX E11.9  Check blood sugar daily 04/21/21   Theron Flavin, MD  levothyroxine  (SYNTHROID ) 50 MCG tablet Take 1 tablet (50 mcg total) by mouth daily before breakfast. 12/06/21   Theron Flavin, MD  losartan  (COZAAR ) 50 MG tablet Take 1 tablet (50 mg total) by mouth 2 (two) times daily. Patient taking differently: Take 50 mg by mouth daily. 12/06/21   Theron Flavin, MD  metFORMIN  (GLUCOPHAGE ) 500 MG tablet Take 1 tablet  (500 mg total) by mouth 2 (two) times daily. 12/06/21   Theron Flavin, MD  prasugrel (EFFIENT) 10 MG TABS tablet Take 1 tablet (10 mg total) by mouth daily. 09/11/23 09/10/24  Althia Atlas, MD    Physical Exam: Vitals:   09/10/23 2023 09/10/23 2030 09/10/23 2054 09/10/23 2100  BP: (!) 159/62 (!) 169/63  (!) 171/59  Pulse: 71 (!) 59  60  Resp: 17 20  20   Temp:   98.2 F (36.8 C)   TempSrc:   Oral   SpO2: 95% 100%  100%  Weight:      Height:       Physical Exam Vitals and nursing note reviewed.  Constitutional:      General: Sheena Parker is not in acute distress.    Appearance: Sheena Parker is normal weight. Sheena Parker is not toxic-appearing.  HENT:     Head: Normocephalic and atraumatic.     Mouth/Throat:     Mouth: Mucous membranes are moist.     Pharynx: Oropharynx is clear.  Eyes:     Conjunctiva/sclera: Conjunctivae  normal.     Pupils: Pupils are equal, round, and reactive to light.  Cardiovascular:     Rate and Rhythm: Normal rate and regular rhythm.     Heart sounds: Murmur (Systolic, heart throughout, 3/6) heard.  Pulmonary:     Effort: Pulmonary effort is normal. No respiratory distress.     Breath sounds: Normal breath sounds. No wheezing, rhonchi or rales.  Abdominal:     General: There is no distension.     Palpations: Abdomen is soft.     Tenderness: There is no abdominal tenderness.  Musculoskeletal:     Right lower leg: No edema.     Left lower leg: No edema.  Skin:    General: Skin is warm and dry.  Neurological:     Mental Status: Sheena Parker is alert.     Comments:  Patient is alert and oriented x 3.  No facial asymmetry or dysarthria. 5 out of 5 strength throughout Normal finger-to-nose bilaterally Sensation grossly intact throughout  Psychiatric:        Mood and Affect: Mood normal.        Behavior: Behavior normal.    Data Reviewed: CBC with WBC of 8.3, hemoglobin of 13.4, platelets of 287 CMP with sodium of 130, potassium 3.8, bicarb 21, glucose 193, BUN 13, creatinine 0.65,  AST 26, ALT 27, GFR above 60 Troponin 84  EKG personally reviewed sinus rhythm with a rate of 51.  No AV block.  Normal QTc.  No acute ischemic changes.  CARDIAC CATHETERIZATION Result Date: 09/10/2023   Ost RCA to Prox RCA lesion is 90% stenosed.   Mid RCA lesion is 50% stenosed.   Dist RCA lesion is 40% stenosed.   Mid LAD lesion is 40% stenosed.   1st Mrg lesion is 60% stenosed.   1st Diag lesion is 60% stenosed.   Ost Cx lesion is 50% stenosed.   Prox Cx lesion is 50% stenosed.   A drug-eluting stent was successfully placed using a STENT ONYX FRONTIER 2.5X12.   Post intervention, there is a 0% residual stenosis.   The left ventricular systolic function is normal.   LV end diastolic pressure is normal.   The left ventricular ejection fraction is 55-65% by visual estimate. 1.  NSTEMI\ 2.  One-vessel coronary artery disease with 90% stenosis ostial RCA 3.  Normal left ventricular function 4.  Successful PCI with 2.5 x 12 mm Onyx frontier DES ostial RCA Recommendations 1.  Dual antiplatelet therapy (aspirin  and prasugrel) uninterrupted x 1 year 2.  Start high intensity atorvastatin  80 mg daily 3.  May consider discharge later today 4.  Follow-up 1 to 2 weeks   There are no new results to review at this time.  Assessment and Plan:  * Syncope Patient is presenting after a syncopal episode at home preceded by dizziness.  This is in the setting of NSTEMI with LHC earlier today. On EKG, Sheena Parker is bradycardic although this appears to be chronic and unchanged.  Echocardiogram yesterday did not demonstrate any significant systolic dysfunction or valvular disease.  Hemodynamically stable at this time.  Unclear etiology, however given high risk for arrhythmia, will observe on telemetry overnight with cardiology to see in the a.m.  - Cardiology consulted; appreciate their recommendations - Telemetry monitoring - Obtain orthostatics - S/p 1 L bolus - Push oral rehydration  CAD S/P percutaneous coronary  angioplasty Patient was admitted on 09/08/2023 and underwent LHC earlier today with DES placed in the RCA.  No recurrence of  chest pain and troponin downtrending  - Continue home DAPT, statin  Hyponatremia Patient has a history of chronic hyponatremia dating back to at least 2021, with baseline around 130.  Currently at baseline.  Given chronicity, likely SIADH.  Diabetes (HCC) Uncontrolled type 2 diabetes with A1c of 9.9%.  - Hold home regimen - SSI, moderate  Essential hypertension - Resume home regimen tomorrow if blood pressure remains stable  Advance Care Planning:   Code Status: Full Code   Consults: Cardiology  Family Communication: Patient's daughter updated at bedside  Severity of Illness: The appropriate patient status for this patient is OBSERVATION. Observation status is judged to be reasonable and necessary in order to provide the required intensity of service to ensure the patient's safety. The patient's presenting symptoms, physical exam findings, and initial radiographic and laboratory data in the context of their medical condition is felt to place them at decreased risk for further clinical deterioration. Furthermore, it is anticipated that the patient will be medically stable for discharge from the hospital within 2 midnights of admission.   Author: Avi Body, MD 09/10/2023 9:27 PM  For on call review [email or web address].

## 2023-09-10 NOTE — Progress Notes (Signed)
 [facility]IN[facility] CARDIOLOGY PROGRESS NOTE   Patient ID: Sheena Parker MRN: [id number] DOB/AGE: 72/09/1950 72 y.o.  Admit date: 09/08/2023 Referring Physician Dr. Althia Atlas Primary Physician Theron Flavin, MD  Primary Cardiologist Dr. Loetta Ringer Reason for Consultation NSTEMI  HPI: Sheena Parker is a 73 y.o. female with a past medical history of peripheral vascular disease s/p L ICA stent 10/2019, hypertension, hyperlipidemia, type II diabetes, peripheral neuropathy, hypothyroidism who presented to the ED on 09/08/2023 for chest pain. Found to have elevated troponins. Cardiology consulted for further evaluation.   Interval History:  -Patient seen and examined this afternoon following LHC.  -Reports she is feeling much better, denies any recurrence of chest pain symptoms.  -Reviewed echo and cath results with patient.  -BP and HR remain stable.   Review of systems complete and found to be negative unless listed above    Vitals:   09/10/23 1115 09/10/23 1130 09/10/23 1145 09/10/23 1200  BP: (!) 142/67 (!) 161/83 (!) 148/98 133/77  Pulse: (!) 49 (!) 51 (!) 47 (!) 50  Resp: 20 15 17 17   Temp:      TempSrc:      SpO2: 99% 100% 98% 99%  Weight:      Height:         Intake/Output Summary (Last 24 hours) at 09/10/2023 1217 Last data filed at 09/10/2023 1015 Gross per 24 hour  Intake 917.97 ml  Output --  Net 917.97 ml     PHYSICAL EXAM General: Well appearing female, well nourished, in no acute distress. HEENT: Normocephalic and atraumatic. Neck: No JVD.  Lungs: Normal respiratory effort on room air. Clear bilaterally to auscultation. No wheezes, crackles, rhonchi.  Heart: HRRR. Normal S1 and S2 without gallops or murmurs. Radial & DP pulses 2+ bilaterally. Abdomen: Non-distended appearing.  Msk: Normal strength and tone for age. Extremities: No clubbing, cyanosis or edema.   Neuro: Alert and oriented X 3. Psych: Mood appropriate, affect congruent.    LABS: Basic Metabolic  Panel: Recent Labs    09/09/23 0520 09/10/23 0535  NA 132* 131*  K 4.1 3.9  CL 99 96*  CO2 24 24  GLUCOSE 167* 164*  BUN 16 16  CREATININE 0.60 0.58  CALCIUM  9.1 8.8*  MG 1.9 2.0  PHOS 4.3 4.0   Liver Function Tests: No results for input(s): "AST", "ALT", "ALKPHOS", "BILITOT", "PROT", "ALBUMIN" in the last 72 hours. No results for input(s): "LIPASE", "AMYLASE" in the last 72 hours. CBC: Recent Labs    09/09/23 0520 09/10/23 0535  WBC 9.3 7.3  HGB 13.0 13.0  HCT 37.8 37.5  MCV 82.9 83.5  PLT 264 252   Cardiac Enzymes: Recent Labs    09/08/23 1215 09/08/23 1433 09/08/23 1757 09/08/23 2331  CKTOTAL 162  --   --   --   TROPONINIHS 163* 159* 192* 203*   BNP: No results for input(s): "BNP" in the last 72 hours. D-Dimer: No results for input(s): "DDIMER" in the last 72 hours. Hemoglobin A1C: Recent Labs    09/08/23 1215  HGBA1C 9.9*   Fasting Lipid Panel: Recent Labs    09/08/23 1433  CHOL 88  HDL 31*  LDLCALC 40  TRIG 85  CHOLHDL 2.8   Thyroid  Function Tests: Recent Labs    09/08/23 1433  TSH 6.907*   Anemia Panel: No results for input(s): "VITAMINB12", "FOLATE", "FERRITIN", "TIBC", "IRON", "RETICCTPCT" in the last 72 hours.  ECHOCARDIOGRAM COMPLETE Result Date: 09/10/2023    ECHOCARDIOGRAM REPORT   Patient Name:  Sheena Parker Date of Exam: 09/09/2023 Medical Rec #:  [id number]    Height:       60.0 in Accession #:    [id number]   Weight:       141.0 lb Date of Birth:  07/22/1950    BSA:          1.609 m Patient Age:    72 years     BP:           145/42 mmHg Patient Gender: F            HR:           51 bpm. Exam Location:  [facility] Procedure: 2D Echo, Cardiac Doppler and Color Doppler (Both Spectral and Color            Flow Doppler were utilized during procedure). Indications:     CHEST PAIN R07.9  History:         Patient has no prior history of Echocardiogram examinations.  Sonographer:     Jane Meager RDCS Referring Phys:  [id number] [facility] Diagnosing  Phys: Joetta Mustache  Sonographer Comments: Image acquisition challenging due to respiratory motion. IMPRESSIONS  1. Left ventricular ejection fraction, by estimation, is 70 to 75%. The left ventricle has hyperdynamic function. The left ventricle has no regional wall motion abnormalities. There is mild left ventricular hypertrophy. Left ventricular diastolic parameters are consistent with Grade I diastolic dysfunction (impaired relaxation).  2. Right ventricular systolic function is normal. The right ventricular size is normal.  3. The mitral valve is normal in structure. Trivial mitral valve regurgitation. Moderate mitral annular calcification.  4. The aortic valve is tricuspid. There is mild thickening of the aortic valve. Aortic valve regurgitation is not visualized.  5. The inferior vena cava is normal in size with greater than 50% respiratory variability, suggesting right atrial pressure of 3 mmHg. FINDINGS  Left Ventricle: Left ventricular ejection fraction, by estimation, is 70 to 75%. The left ventricle has hyperdynamic function. The left ventricle has no regional wall motion abnormalities. The left ventricular internal cavity size was normal in size. There is mild left ventricular hypertrophy. Left ventricular diastolic parameters are consistent with Grade I diastolic dysfunction (impaired relaxation). Right Ventricle: The right ventricular size is normal. No increase in right ventricular wall thickness. Right ventricular systolic function is normal. Left Atrium: Left atrial size was normal in size. Right Atrium: Right atrial size was normal in size. Pericardium: There is no evidence of pericardial effusion. Mitral Valve: The mitral valve is normal in structure. Moderate mitral annular calcification. Trivial mitral valve regurgitation. Tricuspid Valve: The tricuspid valve is normal in structure. Tricuspid valve regurgitation is trivial. Aortic Valve: The aortic valve is tricuspid. There is mild thickening  of the aortic valve. Aortic valve regurgitation is not visualized. Aortic valve peak gradient measures 13.3 mmHg. Pulmonic Valve: The pulmonic valve was not well visualized. Pulmonic valve regurgitation is not visualized. Aorta: The aortic root and ascending aorta are structurally normal, with no evidence of dilitation. Venous: The inferior vena cava is normal in size with greater than 50% respiratory variability, suggesting right atrial pressure of 3 mmHg. IAS/Shunts: The atrial septum is grossly normal.  LEFT VENTRICLE PLAX 2D LVIDd:         3.80 cm   Diastology LVIDs:         2.50 cm   LV e' medial:    4.90 cm/s LV PW:         1.00 cm  LV E/e' medial:  18.2 LV IVS:        1.10 cm   LV e' lateral:   8.70 cm/s LVOT diam:     2.00 cm   LV E/e' lateral: 10.2 LV SV:         118 LV SV Index:   73 LVOT Area:     3.14 cm  RIGHT VENTRICLE RV Basal diam:  2.60 cm RV S prime:     14.40 cm/s TAPSE (M-mode): 2.1 cm LEFT ATRIUM             Index        RIGHT ATRIUM           Index LA diam:        3.90 cm 2.42 cm/m   RA Area:     10.40 cm LA Vol (A2C):   42.2 ml 26.23 ml/m  RA Volume:   20.10 ml  12.49 ml/m LA Vol (A4C):   36.1 ml 22.44 ml/m LA Biplane Vol: 39.1 ml 24.30 ml/m  AORTIC VALVE                 PULMONIC VALVE AV Area (Vmax): 2.46 cm     PV Vmax:        1.17 m/s AV Vmax:        182.50 cm/s  PV Peak grad:   5.5 mmHg AV Peak Grad:   13.3 mmHg    RVOT Peak grad: 4 mmHg LVOT Vmax:      143.00 cm/s LVOT Vmean:     99.300 cm/s LVOT VTI:       0.376 m  AORTA Ao Root diam: 3.60 cm Ao Asc diam:  3.20 cm MITRAL VALVE                TRICUSPID VALVE MV Area (PHT): 2.30 cm     TR Peak grad:   4.6 mmHg MV Decel Time: 330 msec     TR Vmax:        107.00 cm/s MV E velocity: 89.10 cm/s MV A velocity: 159.00 cm/s  SHUNTS MV E/A ratio:  0.56         Systemic VTI:  0.38 m                             Systemic Diam: 2.00 cm Joetta Mustache Electronically signed by Joetta Mustache Signature Date/Time: 09/10/2023/11:55:13 AM    Final     CARDIAC CATHETERIZATION Result Date: 09/10/2023   Ost RCA to Prox RCA lesion is 90% stenosed.   Mid RCA lesion is 50% stenosed.   Dist RCA lesion is 40% stenosed.   Mid LAD lesion is 40% stenosed.   1st Mrg lesion is 60% stenosed.   1st Diag lesion is 60% stenosed.   Ost Cx lesion is 50% stenosed.   Prox Cx lesion is 50% stenosed.   A drug-eluting stent was successfully placed using a STENT ONYX FRONTIER 2.5X12.   Post intervention, there is a 0% residual stenosis.   The left ventricular systolic function is normal.   LV end diastolic pressure is normal.   The left ventricular ejection fraction is 55-65% by visual estimate. 1.  NSTEMI\ 2.  One-vessel coronary artery disease with 90% stenosis ostial RCA 3.  Normal left ventricular function 4.  Successful PCI with 2.5 x 12 mm Onyx frontier DES ostial RCA Recommendations 1.  Dual antiplatelet therapy (aspirin  and prasugrel) uninterrupted x  1 year 2.  Start high intensity atorvastatin  80 mg daily 3.  May consider discharge later today 4.  Follow-up 1 to 2 weeks   DG Chest 2 View Result Date: 09/08/2023 CLINICAL DATA:  Chest pain EXAM: CHEST - 2 VIEW COMPARISON:  None Available. FINDINGS: Lungs are clear.  No pneumothorax. Heart size and mediastinal contours are within normal limits. Aortic Atherosclerosis (ICD10-170.0). No effusion. Visualized bones unremarkable.  Left carotid stent. IMPRESSION: No acute cardiopulmonary disease. Electronically Signed   By: Nicoletta Barrier M.D.   On: 09/08/2023 12:35     ECHO as above  TELEMETRY reviewed by me 09/10/23: sinus bradycardia rate 50s  EKG reviewed by me 09/10/23: Sinus bradycardia 56 bpm  DATA reviewed by me 09/10/23: last 24h vitals tele labs imaging I/O, hospitalist progress note, cath report  Principal Problem:   NSTEMI (non-ST elevated myocardial infarction) (HCC)    ASSESSMENT AND PLAN: Emmani Chadha is a 73 y.o. female with a past medical history of peripheral vascular disease s/p L ICA stent 10/2019,  hypertension, hyperlipidemia, type II diabetes, peripheral neuropathy, hypothyroidism who presented to the ED on 09/08/2023 for chest pain. Found to have elevated troponins. Cardiology consulted for further evaluation.   # NSTEMI # Hypertension # Hyperlipidemia Patient presented with complaints of chest pain, troponins found to be elevated trending 163 > 159 > 192 > 203. EKG without acute ischemic changes. Started on IV heparin  in the ED. Echo this admission with EF 70-75%, no WMAs, mild LVH and grade I diastolic dysfunction. LHC this AM with 90% ostial RCA lesion s/p 2.5 x 12 mm Onyx frontier. -Continue aspirin  81 mg daily and effient 10 mg daily. Plan for DAPT uninterrupted x12 months.  -Increase atorvastatin  80 mg daily.  -Continue amlodipine  10 mg daily and losartan  50 mg daily.   Ok for discharge today from a cardiac perspective. Will arrange for follow up in clinic with Dr. Parks Bollman in 1-2 weeks.    This patient's case was discussed and created with Dr. Bob Burn and he is in agreement.  Signed:  Hamp Levine, PA-C  09/10/2023, 12:17 PM [facility] Cardiology

## 2023-09-10 NOTE — Progress Notes (Signed)
 PHARMACY - ANTICOAGULATION CONSULT NOTE  Pharmacy Consult for heparin  infusion Indication: chest pain/ACS  No Known Allergies  Patient Measurements: Height: 5' (152.4 cm) Weight: 64 kg (141 lb) IBW/kg (Calculated) : 45.5 HEPARIN  DW (KG): 59  Vital Signs: Temp: 97.9 F (36.6 C) (05/05 0438) Temp Source: Oral (05/05 0438) BP: 150/62 (05/05 0438) Pulse Rate: 51 (05/05 0438)  Labs: Recent Labs    09/08/23 1215 09/08/23 1215 09/08/23 1433 09/08/23 1757 09/08/23 2331 09/09/23 0520 09/09/23 1003 09/09/23 1808 09/10/23 0535  HGB 13.3  --   --   --   --  13.0  --   --  13.0  HCT 38.5  --   --   --   --  37.8  --   --  37.5  PLT 278  --   --   --   --  264  --   --  252  APTT  --   --  33  --   --   --   --   --   --   LABPROT  --   --  14.4  --   --   --   --   --   --   INR  --   --  1.1  --   --   --   --   --   --   HEPARINUNFRC  --    < >  --   --  0.19*  --  0.42 0.42 0.56  CREATININE 0.63  --   --   --   --  0.60  --   --  0.58  CKTOTAL 162  --   --   --   --   --   --   --   --   TROPONINIHS 163*  --  159* 192* 203*  --   --   --   --    < > = values in this interval not displayed.    Estimated Creatinine Clearance: 53.9 mL/min (by C-G formula based on SCr of 0.58 mg/dL).   Medical History: Past Medical History:  Diagnosis Date   Anemia 02/16/2020   Arthritis    KNEE, hands   Diabetes mellitus without complication (HCC)    Difficult intubation    Gall stones 10/31/2016   GERD (gastroesophageal reflux disease)    NO MEDS   Hyperlipidemia    Hypertension    Hypothyroidism    Presence of dental prosthetic device    Dental implants,  top - front 2 teeth   Thyroid  disease    Weight decrease 02/16/2020   Weight loss 02/16/2020    Assessment: 73 y.o. female past medical history significant for diabetes, hypertension, who presents to the emergency department with chest pain.  A review of medical records reveals no chronic anticoagulation prior to  arrival  Baseline Labs: pending  Goal of Therapy:  Heparin  level 0.3-0.7 units/ml Monitor platelets by anticoagulation protocol: Yes  05/03 2331 HL 0.19, subtherapeutic 5/4 1003 HL 0.42   therapeutic x1 5/5 0535 HL 0.56, therapeutic x 2    Plan:  Continue heparin  infusion at 950 units/hr Recheck HL daily w/ AM labs while therapeutic CBC daily while on heparin   Coretta Dexter, PharmD, [facility] 09/10/2023 7:03 AM

## 2023-09-10 NOTE — Assessment & Plan Note (Addendum)
 Patient is presenting after a syncopal episode at home preceded by dizziness.  This is in the setting of NSTEMI with LHC earlier today. On EKG, she is bradycardic although this appears to be chronic and unchanged.  Echocardiogram yesterday did not demonstrate any significant systolic dysfunction or valvular disease.  Hemodynamically stable at this time.  Unclear etiology, however given high risk for arrhythmia, will observe on telemetry overnight with cardiology to see in the a.m.  - Cardiology consulted; appreciate their recommendations - Telemetry monitoring - Obtain orthostatics - S/p 1 L bolus - Push oral rehydration

## 2023-09-10 NOTE — ED Triage Notes (Signed)
 Pt had stent placed for NSTEMI placed this morning and after d/c she became dizzy and had a syncopal episode and fell on the floor. Pt denies any pain and only complaint is dizziness. Denies hitting her head. Denies pain. EMS vitals BP 155/76 HR 50s SB  SPO2 98% RA  CBG 226

## 2023-09-10 NOTE — ED Notes (Signed)
 CCMD called to initiate cardiac monitoring

## 2023-09-10 NOTE — Discharge Summary (Signed)
 [facility] Discharge Summary   Patient: Sheena Parker NFA:[id number]  PCP: Theron Flavin, MD  Date of admission: 09/08/2023   Date of discharge: 09/10/2023     Discharge Diagnoses:  Principal Problem:   NSTEMI (non-ST elevated myocardial infarction) [facility])   Admitted From: Home Disposition:  Home   Recommendations for Outpatient Follow-up:  Follow-up with PCP in 1 week, repeat TSH level after 4 weeks and titrate dose of Synthroid  accordingly.  Hemoglobin A1c is elevated, need stricter blood glucose control. Follow-up with cardiology in 1 week Follow up LABS/TEST:     Follow-up Information     Paraschos, Alexander, MD. Go in 1 week(s).   Specialty: Cardiology Contact information: [address] [facility] [place] [address] [phone]         Theron Flavin, MD Follow up in 1 week(s).   Specialties: Internal Medicine, Cardiology Contact information: [address] [place] [address] [phone]                Diet recommendation: Cardiac and Carb modified diet  Activity: The patient is advised to gradually reintroduce usual activities, as tolerated  Discharge Condition: stable  Code Status: Full code   History of present illness: As per the H and P dictated on admission. Hospital Course:  Sheena Parker is a 73 y.o. female with PMH of HTN, NIDDM T2, peripheral neuropathy, hypothyroid, carotid stenosis s/p left ICA stent 6/21,/significant stenosis on the right carotid artery, workup pending as per vascular surgery, presented at [facility] ED with complaining of chest pain.  As per patient she was walking around at home with little exertion to get work done at home and suddenly she felt left-sided chest pain, pressure and lasted for 15 minutes.  Pain resolved with rest and drinking water.  Patient also felt sweating.  Did not take any medication at home.  In the ED patient was chest pain-free.   Assessment and Plan:   # NSTEMI  (non-ST elevated myocardial infarction) Troponin peaked 203, Chest pain resolved Continue aspirin  81 mg p.o. daily, nitroglycerin as needed S/p heparin  IV infusion. TTE LV hypertension, no WMA, grade 1 diastolic dysfunction.  No significant abnormality. Cardiology consulted s/p cardiac Cath, one vessel CAD, 90% ostial RCA stenosis, s/p successful PCI.  Continue dual antiplatelet therapy aspirin  uninterrupted for 1 year.  High intensity atorvastatin  80 mg p.o. daily.  Patient was continued recommended to discharge later on today. Patient remained stable, tolerated procedure well.  Patient would like to go home.  Cleared to discharge.  Medications sent [facility] pharmacy to be delivered at bedside.   # Hypertensive emergency Resumed home meds amlodipine  5 mg p.o. daily and losartan  50 mg p.o. daily. On 5/4 increased amlodipine  from 5 to 10 mg p.o. daily.  Today BP seems stable, so continued same dose amlodipine  5mg .  Advised to monitor BP at home and follow with PCP.   # Carotid stenosis: S/p left ICA stent in 10/2019 Right ICA 60 to 79% stenosis, patient will need CTA neck and follow-up as an outpatient with vascular surgery   # Hypothyroidism: Continued home dose Synthroid  50 mcg p.o. daily. TSH 5.9 elevated, recommend to follow with PCP to titrate dose accordingly   # NIDDM T2, with peripheral neuropathy Hemoglobin A1c 9.9, elevated, poorly controlled diabetes Held metformin  during hospital stay. Resumed gabapentin  home dose. S/p NovoLog sliding scale. Monitor CBG, continue diabetic diet Resumed home medications on discharge.  Body mass index is 27.56 kg/m.  Nutrition Interventions:  -  Patient was instructed, not to drive, operate heavy machinery, perform activities at heights, swimming or participation in water activities or provide baby sitting services while on Pain, Sleep and Anxiety Medications; until her outpatient Physician has advised to do so again.  - Also recommended to not to take  more than prescribed Pain, Sleep and Anxiety Medications.  Patient was ambulatory without any assistance. On the day of the discharge the patient's vitals were stable, and no other acute medical condition were reported by patient. the patient was felt safe to be discharge at Home.  Consultants: Cardiology Procedures: Cardiac cath   Discharge Exam: General: Appear in no distress, no Rash; Oral Mucosa Clear, moist. Cardiovascular: S1 and S2 Present, no Murmur, Respiratory: normal respiratory effort, Bilateral Air entry present and no Crackles, no wheezes Abdomen: Bowel Sound present, Soft and no tenderness, no hernia Extremities: no Pedal edema, no calf tenderness Neurology: alert and oriented to time, place, and person affect appropriate.  Filed Weights   09/08/23 1210 09/10/23 0755  Weight: 64 kg 64 kg   Vitals:   09/10/23 1245 09/10/23 1300  BP: (!) 153/76 (!) 162/63  Pulse: (!) 49 (!) 49  Resp: 17 20  Temp:    SpO2: 98% 97%    DISCHARGE MEDICATION: Allergies as of 09/10/2023   No Known Allergies      Medication List     TAKE these medications    acetaminophen  325 MG tablet Commonly known as: TYLENOL  Take 650 mg by mouth every 6 (six) hours as needed for mild pain or headache.   amLODipine  5 MG tablet Commonly known as: NORVASC  Take 1 tablet (5 mg total) by mouth daily.   aspirin  EC 81 MG tablet Take 1 tablet (81 mg total) by mouth daily.   atorvastatin  80 MG tablet Commonly known as: LIPITOR Take 1 tablet (80 mg total) by mouth at bedtime. What changed:  medication strength how much to take   gabapentin  100 MG capsule Commonly known as: NEURONTIN  Take 1 capsule (100 mg total) by mouth 3 (three) times daily.   glucose blood test strip DX E11.9  Check blood sugar daily   levothyroxine  50 MCG tablet Commonly known as: SYNTHROID  Take 1 tablet (50 mcg total) by mouth daily before breakfast.   losartan  50 MG tablet Commonly known as: COZAAR  Take 1  tablet (50 mg total) by mouth 2 (two) times daily. What changed: when to take this   metFORMIN  500 MG tablet Commonly known as: GLUCOPHAGE  Take 1 tablet (500 mg total) by mouth 2 (two) times daily.   prasugrel 10 MG Tabs tablet Commonly known as: EFFIENT Take 1 tablet (10 mg total) by mouth daily. Start taking on: Sep 11, 2023   Vitamin D  125 MCG (5000 UT) Caps 1  Capsule daily       No Known Allergies Discharge Instructions     AMB Referral to Cardiac Rehabilitation - Phase II   Complete by: As directed    Diagnosis:  Coronary Stents NSTEMI     After initial evaluation and assessments completed: Virtual Based Care may be provided alone or in conjunction with Phase 2 Cardiac Rehab based on patient barriers.: Yes   Intensive Cardiac Rehabilitation (ICR) [facility] location only OR Traditional Cardiac Rehabilitation (TCR) *If criteria for ICR are not met will enroll in TCR [facility] only): Yes   Call MD for:   Complete by: As directed    Chest pain or palpitations   Call MD for:  difficulty breathing, headache  or visual disturbances   Complete by: As directed    Call MD for:  extreme fatigue   Complete by: As directed    Call MD for:  persistant dizziness or light-headedness   Complete by: As directed    Call MD for:  persistant nausea and vomiting   Complete by: As directed    Call MD for:  severe uncontrolled pain   Complete by: As directed    Diet - low sodium heart healthy   Complete by: As directed    Discharge instructions   Complete by: As directed    Follow-up with PCP in 1 week, repeat TSH level after 4 weeks and titrate dose of Synthroid  accordingly.  Hemoglobin A1c is elevated, need stricter blood glucose control. Follow-up with cardiology in 1 week   Increase activity slowly   Complete by: As directed        The results of significant diagnostics from this hospitalization (including imaging, microbiology, ancillary and laboratory) are listed below for reference.     Significant Diagnostic Studies: ECHOCARDIOGRAM COMPLETE Result Date: 09/10/2023    ECHOCARDIOGRAM REPORT   Patient Name:   KAI KENNON Date of Exam: 09/09/2023 Medical Rec #:  [id number]    Height:       60.0 in Accession #:    [id number]   Weight:       141.0 lb Date of Birth:  04-Mar-1951    BSA:          1.609 m Patient Age:    72 years     BP:           145/42 mmHg Patient Gender: F            HR:           51 bpm. Exam Location:  [facility] Procedure: 2D Echo, Cardiac Doppler and Color Doppler (Both Spectral and Color            Flow Doppler were utilized during procedure). Indications:     CHEST PAIN R07.9  History:         Patient has no prior history of Echocardiogram examinations.  Sonographer:     Jane Meager RDCS Referring Phys:  [id number] [facility] Diagnosing Phys: Joetta Mustache  Sonographer Comments: Image acquisition challenging due to respiratory motion. IMPRESSIONS  1. Left ventricular ejection fraction, by estimation, is 70 to 75%. The left ventricle has hyperdynamic function. The left ventricle has no regional wall motion abnormalities. There is mild left ventricular hypertrophy. Left ventricular diastolic parameters are consistent with Grade I diastolic dysfunction (impaired relaxation).  2. Right ventricular systolic function is normal. The right ventricular size is normal.  3. The mitral valve is normal in structure. Trivial mitral valve regurgitation. Moderate mitral annular calcification.  4. The aortic valve is tricuspid. There is mild thickening of the aortic valve. Aortic valve regurgitation is not visualized.  5. The inferior vena cava is normal in size with greater than 50% respiratory variability, suggesting right atrial pressure of 3 mmHg. FINDINGS  Left Ventricle: Left ventricular ejection fraction, by estimation, is 70 to 75%. The left ventricle has hyperdynamic function. The left ventricle has no regional wall motion abnormalities. The left ventricular internal cavity size was normal  in size. There is mild left ventricular hypertrophy. Left ventricular diastolic parameters are consistent with Grade I diastolic dysfunction (impaired relaxation). Right Ventricle: The right ventricular size is normal. No increase in right ventricular wall thickness. Right ventricular systolic function is normal. Left Atrium:  Left atrial size was normal in size. Right Atrium: Right atrial size was normal in size. Pericardium: There is no evidence of pericardial effusion. Mitral Valve: The mitral valve is normal in structure. Moderate mitral annular calcification. Trivial mitral valve regurgitation. Tricuspid Valve: The tricuspid valve is normal in structure. Tricuspid valve regurgitation is trivial. Aortic Valve: The aortic valve is tricuspid. There is mild thickening of the aortic valve. Aortic valve regurgitation is not visualized. Aortic valve peak gradient measures 13.3 mmHg. Pulmonic Valve: The pulmonic valve was not well visualized. Pulmonic valve regurgitation is not visualized. Aorta: The aortic root and ascending aorta are structurally normal, with no evidence of dilitation. Venous: The inferior vena cava is normal in size with greater than 50% respiratory variability, suggesting right atrial pressure of 3 mmHg. IAS/Shunts: The atrial septum is grossly normal.  LEFT VENTRICLE PLAX 2D LVIDd:         3.80 cm   Diastology LVIDs:         2.50 cm   LV e' medial:    4.90 cm/s LV PW:         1.00 cm   LV E/e' medial:  18.2 LV IVS:        1.10 cm   LV e' lateral:   8.70 cm/s LVOT diam:     2.00 cm   LV E/e' lateral: 10.2 LV SV:         118 LV SV Index:   73 LVOT Area:     3.14 cm  RIGHT VENTRICLE RV Basal diam:  2.60 cm RV S prime:     14.40 cm/s TAPSE (M-mode): 2.1 cm LEFT ATRIUM             Index        RIGHT ATRIUM           Index LA diam:        3.90 cm 2.42 cm/m   RA Area:     10.40 cm LA Vol (A2C):   42.2 ml 26.23 ml/m  RA Volume:   20.10 ml  12.49 ml/m LA Vol (A4C):   36.1 ml 22.44 ml/m LA Biplane Vol:  39.1 ml 24.30 ml/m  AORTIC VALVE                 PULMONIC VALVE AV Area (Vmax): 2.46 cm     PV Vmax:        1.17 m/s AV Vmax:        182.50 cm/s  PV Peak grad:   5.5 mmHg AV Peak Grad:   13.3 mmHg    RVOT Peak grad: 4 mmHg LVOT Vmax:      143.00 cm/s LVOT Vmean:     99.300 cm/s LVOT VTI:       0.376 m  AORTA Ao Root diam: 3.60 cm Ao Asc diam:  3.20 cm MITRAL VALVE                TRICUSPID VALVE MV Area (PHT): 2.30 cm     TR Peak grad:   4.6 mmHg MV Decel Time: 330 msec     TR Vmax:        107.00 cm/s MV E velocity: 89.10 cm/s MV A velocity: 159.00 cm/s  SHUNTS MV E/A ratio:  0.56         Systemic VTI:  0.38 m  Systemic Diam: 2.00 cm Joetta Mustache Electronically signed by Joetta Mustache Signature Date/Time: 09/10/2023/11:55:13 AM    Final    CARDIAC CATHETERIZATION Result Date: 09/10/2023   Ost RCA to Prox RCA lesion is 90% stenosed.   Mid RCA lesion is 50% stenosed.   Dist RCA lesion is 40% stenosed.   Mid LAD lesion is 40% stenosed.   1st Mrg lesion is 60% stenosed.   1st Diag lesion is 60% stenosed.   Ost Cx lesion is 50% stenosed.   Prox Cx lesion is 50% stenosed.   A drug-eluting stent was successfully placed using a STENT ONYX FRONTIER 2.5X12.   Post intervention, there is a 0% residual stenosis.   The left ventricular systolic function is normal.   LV end diastolic pressure is normal.   The left ventricular ejection fraction is 55-65% by visual estimate. 1.  NSTEMI\ 2.  One-vessel coronary artery disease with 90% stenosis ostial RCA 3.  Normal left ventricular function 4.  Successful PCI with 2.5 x 12 mm Onyx frontier DES ostial RCA Recommendations 1.  Dual antiplatelet therapy (aspirin  and prasugrel) uninterrupted x 1 year 2.  Start high intensity atorvastatin  80 mg daily 3.  May consider discharge later today 4.  Follow-up 1 to 2 weeks   DG Chest 2 View Result Date: 09/08/2023 CLINICAL DATA:  Chest pain EXAM: CHEST - 2 VIEW COMPARISON:  None Available. FINDINGS: Lungs are  clear.  No pneumothorax. Heart size and mediastinal contours are within normal limits. Aortic Atherosclerosis (ICD10-170.0). No effusion. Visualized bones unremarkable.  Left carotid stent. IMPRESSION: No acute cardiopulmonary disease. Electronically Signed   By: Nicoletta Barrier M.D.   On: 09/08/2023 12:35    Microbiology: No results found for this or any previous visit (from the past 240 hours).   Labs: CBC: Recent Labs  Lab 09/08/23 1215 09/09/23 0520 09/10/23 0535  WBC 7.7 9.3 7.3  HGB 13.3 13.0 13.0  HCT 38.5 37.8 37.5  MCV 83.0 82.9 83.5  PLT 278 264 252   Basic Metabolic Panel: Recent Labs  Lab 09/08/23 1215 09/08/23 1433 09/09/23 0520 09/10/23 0535  NA 129*  --  132* 131*  K 4.1  --  4.1 3.9  CL 97*  --  99 96*  CO2 22  --  24 24  GLUCOSE 190*  --  167* 164*  BUN 16  --  16 16  CREATININE 0.63  --  0.60 0.58  CALCIUM  9.1  --  9.1 8.8*  MG  --  1.8 1.9 2.0  PHOS  --  3.7 4.3 4.0   Liver Function Tests: No results for input(s): "AST", "ALT", "ALKPHOS", "BILITOT", "PROT", "ALBUMIN" in the last 168 hours. No results for input(s): "LIPASE", "AMYLASE" in the last 168 hours. No results for input(s): "AMMONIA" in the last 168 hours. Cardiac Enzymes: Recent Labs  Lab 09/08/23 1215  CKTOTAL 162   BNP (last 3 results) No results for input(s): "BNP" in the last 8760 hours. CBG: Recent Labs  Lab 09/09/23 1231 09/09/23 1752 09/09/23 2305 09/10/23 0747 09/10/23 1038  GLUCAP 178* 231* 150* 180* 191*    Time spent: 35 minutes  Signed:  Althia Atlas  [facility] 09/10/2023 1:05 PM

## 2023-09-10 NOTE — Assessment & Plan Note (Addendum)
 Patient was admitted on 09/08/2023 and underwent LHC earlier today with DES placed in the RCA.  No recurrence of chest pain and troponin downtrending  - Continue home DAPT, statin

## 2023-09-10 NOTE — Inpatient Diabetes Management (Signed)
 Inpatient Diabetes Program Recommendations  AACE/ADA: New Consensus Statement on Inpatient Glycemic Control  Target Ranges:  Prepandial:   less than 140 mg/dL      Peak postprandial:   less than 180 mg/dL (1-2 hours)      Critically ill patients:  140 - 180 mg/dL    Latest Reference Range & Units 09/09/23 08:10 09/09/23 12:31 09/09/23 17:52 09/09/23 23:05 09/10/23 07:47  Glucose-Capillary 70 - 99 mg/dL 130 (H) 865 (H) 784 (H) 150 (H) 180 (H)    Latest Reference Range & Units 09/08/23 12:15  Hemoglobin A1C 4.8 - 5.6 % 9.9 (H)   Review of Glycemic Control  Diabetes history: DM2 Outpatient Diabetes medications: Metformin  500 mg BID Current orders for Inpatient glycemic control: Novolog 0-15 units TID with meals  Inpatient Diabetes Program Recommendations:    HbgA1C:  A1C 9.9% on 09/08/23 indicating an average glucose of 237 mg/dl over the past 2-3 months.  NOTE: Patient admitted with NSTEMI and HTN emergency. CBGs 147-231 mg/dl on 5/4 and 696 mg/dl this morning. Patient is currently in cath lab.   Thanks, Beacher Limerick, RN, MSN, CDCES Diabetes Coordinator Inpatient Diabetes Program [phone] (Team Pager from 8am to 5pm)

## 2023-09-11 DIAGNOSIS — I10 Essential (primary) hypertension: Secondary | ICD-10-CM | POA: Diagnosis not present

## 2023-09-11 DIAGNOSIS — R55 Syncope and collapse: Secondary | ICD-10-CM | POA: Diagnosis not present

## 2023-09-11 DIAGNOSIS — Z9861 Coronary angioplasty status: Secondary | ICD-10-CM | POA: Diagnosis not present

## 2023-09-11 DIAGNOSIS — E785 Hyperlipidemia, unspecified: Secondary | ICD-10-CM | POA: Diagnosis not present

## 2023-09-11 LAB — CBC WITH DIFFERENTIAL/PLATELET
Abs Immature Granulocytes: 0.04 10*3/uL (ref 0.00–0.07)
Basophils Absolute: 0 10*3/uL (ref 0.0–0.1)
Basophils Relative: 0 %
Eosinophils Absolute: 0.1 10*3/uL (ref 0.0–0.5)
Eosinophils Relative: 2 %
HCT: 34.6 % — ABNORMAL LOW (ref 36.0–46.0)
Hemoglobin: 12.1 g/dL (ref 12.0–15.0)
Immature Granulocytes: 0 %
Lymphocytes Relative: 19 %
Lymphs Abs: 1.7 10*3/uL (ref 0.7–4.0)
MCH: 29.4 pg (ref 26.0–34.0)
MCHC: 35 g/dL (ref 30.0–36.0)
MCV: 84.2 fL (ref 80.0–100.0)
Monocytes Absolute: 0.6 10*3/uL (ref 0.1–1.0)
Monocytes Relative: 7 %
Neutro Abs: 6.4 10*3/uL (ref 1.7–7.7)
Neutrophils Relative %: 72 %
Platelets: 244 10*3/uL (ref 150–400)
RBC: 4.11 MIL/uL (ref 3.87–5.11)
RDW: 12.1 % (ref 11.5–15.5)
WBC: 8.9 10*3/uL (ref 4.0–10.5)
nRBC: 0 % (ref 0.0–0.2)

## 2023-09-11 LAB — CBG MONITORING, ED
Glucose-Capillary: 155 mg/dL — ABNORMAL HIGH (ref 70–99)
Glucose-Capillary: 161 mg/dL — ABNORMAL HIGH (ref 70–99)
Glucose-Capillary: 208 mg/dL — ABNORMAL HIGH (ref 70–99)
Glucose-Capillary: 185 mg/dL — ABNORMAL HIGH (ref 70–99)

## 2023-09-11 LAB — BASIC METABOLIC PANEL WITH GFR
Anion gap: 7 (ref 5–15)
BUN: 14 mg/dL (ref 8–23)
CO2: 23 mmol/L (ref 22–32)
Calcium: 8.5 mg/dL — ABNORMAL LOW (ref 8.9–10.3)
Chloride: 101 mmol/L (ref 98–111)
Creatinine, Ser: 0.45 mg/dL (ref 0.44–1.00)
GFR, Estimated: >60 mL/min (ref >60)
Glucose, Bld: 144 mg/dL — ABNORMAL HIGH (ref 70–99)
Potassium: 3.6 mmol/L (ref 3.5–5.1)
Sodium: 131 mmol/L — ABNORMAL LOW (ref 135–145)

## 2023-09-11 LAB — MAGNESIUM: Magnesium: 2 mg/dL (ref 1.7–2.4)

## 2023-09-11 LAB — TROPONIN I (HIGH SENSITIVITY): Troponin I (High Sensitivity): 131 ng/L (ref <18)

## 2023-09-11 MED ORDER — LANCET DEVICE MISC: 1.0000 | Freq: Three times a day (TID) | 0 refills | Status: AC

## 2023-09-11 MED ORDER — METFORMIN HCL 1000 MG PO TABS: 500.0000 mg | ORAL_TABLET | Freq: Two times a day (BID) | ORAL | 0 refills | Status: AC

## 2023-09-11 MED ORDER — LINAGLIPTIN 5 MG PO TABS: 5.0000 mg | ORAL_TABLET | Freq: Every day | ORAL | 0 refills | Status: AC

## 2023-09-11 MED ORDER — BLOOD GLUCOSE TEST VI STRP: 1.0000 | ORAL_STRIP | Freq: Three times a day (TID) | 0 refills | Status: AC

## 2023-09-11 MED ORDER — BLOOD GLUCOSE MONITORING SUPPL DEVI: 1.0000 | Freq: Three times a day (TID) | 0 refills | Status: AC

## 2023-09-11 MED ORDER — LANCETS MISC. MISC: 1.0000 | Freq: Three times a day (TID) | 0 refills | Status: AC

## 2023-09-11 NOTE — Care Management Obs Status (Signed)
 MEDICARE OBSERVATION STATUS NOTIFICATION   Patient Details  Name: Sheena Parker MRN: [id number] Date of Birth: Nov 26, 1950   Medicare Observation Status Notification Given:  Yes    Nellene Banana Cathy Ropp, RN 09/11/2023, 12:57 PM

## 2023-09-11 NOTE — ED Notes (Signed)
 CM at [facility] speaking with pt and family, HHRN established, pending d/c, waiting on DME.

## 2023-09-11 NOTE — ED Notes (Signed)
 Pt up to b/r with walker and family.

## 2023-09-11 NOTE — Inpatient Diabetes Management (Signed)
 Inpatient Diabetes Program Recommendations  AACE/ADA: New Consensus Statement on Inpatient Glycemic Control  Target Ranges:  Prepandial:   less than 140 mg/dL      Peak postprandial:   less than 180 mg/dL (1-2 hours)      Critically ill patients:  140 - 180 mg/dL    Latest Reference Range & Units 09/10/23 07:47 09/10/23 10:38 09/10/23 20:42 09/10/23 23:29 09/11/23 05:13 09/11/23 07:54 09/11/23 11:18  Glucose-Capillary 70 - 99 mg/dL 161 (H) 096 (H) 045 (H) 232 (H) 155 (H) 161 (H) 208 (H)    Latest Reference Range & Units 09/08/23 12:15  Hemoglobin A1C 4.8 - 5.6 % 9.9 (H)   Review of Glycemic Control  Diabetes history: DM2 Outpatient Diabetes medications: Metformin  500 mg BID Current orders for Inpatient glycemic control: Novolog 0-15 units TID with meals   Inpatient Diabetes Program Recommendations:     HbgA1C:  A1C 9.9% on 09/08/23 indicating an average glucose of 237 mg/dl over the past 2-3 months.    Outpatient DM: AT time of discharge please provide Rx for glucometer kit (#[id number]) and oral DM medications. Recommend to increase Metformin  to 1000 mg BID and also add Tradjenta 5 mg daily.      NOTE: Patient admitted with NSTEMI and HTN emergency 09/08/23-09/10/23. Patient discharged home yesterday after heart cath and stent placement. Patient expired dizziness and syncopal episode at home and fell. Spoke with patient and her daughter at bedside. Patient states that she is taking Metformin  500 mg BID for DM and she has never been on any other DM medications. Patient states she checks glucose at home and it ranges from mid 100's to mid 200's mg/dl. Patient is not aware of what an A1C is. Explained what an A1C is and discussed A1C results (9.9% on 09/08/23) and explained that current A1C indicates an average glucose of 237 mg/dl over the past 2-3 months. Discussed glucose and A1C goals. Discussed importance of checking CBGs and maintaining good CBG control to prevent long-term and short-term  complications. Explained how hyperglycemia leads to damage within blood vessels which lead to the common complications seen with uncontrolled diabetes. Stressed to the patient the importance of improving glycemic control to prevent further complications from uncontrolled diabetes. Patient reports that her PCP recently retired and she will need to get a new PCP. She is considering Dr. Meredeth Stallion; informed patient that it would be requested that [facility] attach list of local providers taking new patients in case she needs it.  Patient reports that her current glucometer is old and she would like to get a new glucometer and testing supplies. Discussed that it would be recommended that she be prescribed an additional DM medication at discharge to take along with the Metformin .  Discussed Tradjenta and how it works. Reviewed hyperglycemia, hypoglycemia, and treatment for both.  Patient asked if she could get a walker to use at home especially with the dizziness she is having. Informed patient I would consult TOC to address their request for a walker.  Encouraged patient to get established with a new PCP for consistent follow up so she will be able to continue to get needed prescriptions and help with DM management.  Patient and daughter verbalized understanding of information discussed and reports no further questions at this time related to diabetes.  Thanks, Beacher Limerick, RN, MSN, CDE Diabetes Coordinator Inpatient Diabetes Program [phone] (Team Pager)

## 2023-09-11 NOTE — Evaluation (Signed)
 Physical Therapy Evaluation Patient Details Name: Sheena Parker MRN: [id number] DOB: April 23, 1951 Today's Date: 09/11/2023  History of Present Illness  presented to ER secondary to dizziness; admitted for syncope workup (possibly ?hypovolemic?)  Clinical Impression  Patient resting in bed upon arrival to room; alert and oriented, follows commands and agreeable to participation with session.  Multiple, supportive family members (daughter, daughter-in-law and husband) noted at bedside.  Patient denies pain; denies recurrent dizziness/syncope with short distances to/from bathroom during admission. Noted with mild bruising to R wrist/forearm (noted s/p LHC via R radial artery 5/5).  Able to complete bed mobility with mod indep; sit/stand, basic transfers and gait (200') with RW, cga/close sup. Demonstrates reciprocal stepping pattern with good step height/length, good cadence and overall stability. Denies dizziness, lightheadness; endorses feeling at/near baseline, but does prefer continued use of RW due to self-perceived weakness/endurance deficits  Would benefit from skilled PT to address above deficits and promote optimal return to PLOF.; recommend post-acute PT follow up as indicated by interdisciplinary care team.            If plan is discharge home, recommend the following: A little help with walking and/or transfers;A little help with bathing/dressing/bathroom   Can travel by private vehicle        Equipment Recommendations Rolling walker (2 wheels)  Recommendations for Other Services       Functional Status Assessment Patient has had a recent decline in their functional status and demonstrates the ability to make significant improvements in function in a reasonable and predictable amount of time.     Precautions / Restrictions Precautions Precautions: None Precaution/Restrictions Comments: Recent R radial LHC Restrictions Weight Bearing Restrictions Per Provider Order: No       Mobility  Bed Mobility Overal bed mobility: Modified Independent                  Transfers Overall transfer level: Needs assistance Equipment used: Rolling walker (2 wheels) Transfers: Sit to/from Stand Sit to Stand: Contact guard assist                Ambulation/Gait Ambulation/Gait assistance: Contact guard assist Gait Distance (Feet): 200 Feet Assistive device: Rolling walker (2 wheels)         General Gait Details: reciprocal stepping pattern with good step height/length, good cadence and overall stability.  Denies dizziness, lightheadness; endorses feeling at/near baseline, but does prefer continued use of RW due to self-perceived weakness/endurance deficits  Stairs            Wheelchair Mobility     Tilt Bed    Modified Rankin (Stroke Patients Only)       Balance Overall balance assessment: Needs assistance Sitting-balance support: No upper extremity supported, Feet supported Sitting balance-Leahy Scale: Good     Standing balance support: Bilateral upper extremity supported Standing balance-Leahy Scale: Fair                               Pertinent Vitals/Pain Pain Assessment Pain Assessment: No/denies pain    Home Living Family/patient expects to be discharged to:: Private residence Living Arrangements: Spouse/significant other Available Help at Discharge: Family Type of Home: House Home Access: Stairs to enter Entrance Stairs-Rails: Doctor, general practice of Steps: 3-4   Home Layout: Two level;Able to live on main level with bedroom/bathroom Home Equipment: None      Prior Function Prior Level of Function : Independent/Modified Independent  Mobility Comments: Indep with ADLs, household and community mobilization without assist device; denies fall history. Family very supportive and engaged.       Extremity/Trunk Assessment   Upper Extremity Assessment Upper Extremity Assessment:  Overall WFL for tasks assessed    Lower Extremity Assessment Lower Extremity Assessment: Overall WFL for tasks assessed       Communication        Cognition Arousal: Alert Behavior During Therapy: WFL for tasks assessed/performed   PT - Cognitive impairments: No apparent impairments                                 Cueing       General Comments      Exercises     Assessment/Plan    PT Assessment Patient needs continued PT services  PT Problem List Decreased strength;Decreased range of motion;Decreased activity tolerance;Decreased balance;Decreased mobility;Decreased knowledge of use of DME;Decreased safety awareness;Decreased knowledge of precautions       PT Treatment Interventions DME instruction;Gait training;Stair training;Functional mobility training;Therapeutic exercise;Therapeutic activities;Balance training;Patient/family education    PT Goals (Current goals can be found in the Care Plan section)  Acute Rehab PT Goals Patient Stated Goal: to go home PT Goal Formulation: With patient/family Time For Goal Achievement: 09/25/23 Potential to Achieve Goals: Good    Frequency Min 1X/week     Co-evaluation               AM-PAC PT "6 Clicks" Mobility  Outcome Measure Help needed turning from your back to your side while in a flat bed without using bedrails?: None Help needed moving from lying on your back to sitting on the side of a flat bed without using bedrails?: None Help needed moving to and from a bed to a chair (including a wheelchair)?: None Help needed standing up from a chair using your arms (e.g., wheelchair or bedside chair)?: None Help needed to walk in hospital room?: A Little Help needed climbing 3-5 steps with a railing? : A Little 6 Click Score: 22    End of Session Equipment Utilized During Treatment: Gait belt Activity Tolerance: Patient tolerated treatment well Patient left: in bed;with call bell/phone within  reach;with family/visitor present Nurse Communication: Mobility status PT Visit Diagnosis: Muscle weakness (generalized) (M62.81);Difficulty in walking, not elsewhere classified (R26.2)    Time: 1610-9604 PT Time Calculation (min) (ACUTE ONLY): 20 min   Charges:   PT Evaluation $PT Eval Low Complexity: 1 Low   PT General Charges $$ ACUTE PT VISIT: 1 Visit        Sharina Petre H. Bevin Bucks, PT, DPT, NCS 09/11/23, 2:09 PM [phone]

## 2023-09-11 NOTE — Discharge Instructions (Addendum)
 Some PCP options in [place] area- not a comprehensive list  [facility]- [phone] [facility]- [phone] [facility]- [phone] [facility]- [phone] [facility]- [phone] [facility]- [phone]  or [facility] Health Physician Referral Line [phone]   Please get a PCP right away as you will need one for further home health care orders. The home health care agency is called [facility], phone #  [phone].

## 2023-09-11 NOTE — ED Notes (Signed)
 PT/OT therapy at [facility].Family present. Pending d/c dispo and plan. Pt alert, NAD, calm, interactive, cooperative.

## 2023-09-11 NOTE — Progress Notes (Signed)
 OT Cancellation Note  Patient Details Name: Sheena Parker MRN: [id number] DOB: 1950-05-12   Cancelled Treatment:    Reason Eval/Treat Not Completed: OT screened, no needs identified, will sign off. Consult received, chart reviewed. Spoke with PT after evaluation. Pt completing ADL well, good family support. No acute OT needs at this time. Please re-consult if additional acute OT need arise.   Catelyn Friel R., MPH, MS, OTR/L ascom [phone] 09/11/23, 1:42 PM

## 2023-09-11 NOTE — ED Notes (Signed)
 Secretary called to page provider on critical result

## 2023-09-11 NOTE — ED Notes (Signed)
 CM at [facility].Pending arrival of DME.

## 2023-09-11 NOTE — Discharge Summary (Signed)
 Physician Discharge Summary  Sheena Parker ZOX:[id number] DOB: January 06, 1951 DOA: 09/10/2023  PCP: Theron Flavin, MD  Admit date: 09/10/2023 Discharge date: 09/11/2023  Admitted From: Home Disposition:  Home with home health  Recommendations for Outpatient Follow-up:  Follow up with PCP in 1-2 weeks Follow up cardiology 1 week  Home Health:Yes PT  Equipment/Devices:RW walker, BSC   Discharge Condition:Stable  CODE STATUS:FULL  Diet recommendation: Carb mod  Brief/Interim Summary: 73 y.o. female with medical history significant of NSTEMI s/p PCI with DES to RCA (earlier today), type 2 diabetes, hypertension, hypothyroidism, peripheral neuropathy, carotid artery stenosis s/p stent (June 2021), who presents to the ED due to syncope.   History obtained from both patient and her daughter at bedside.  Sheena Parker states that since she was discharged, she was experiencing dizziness.  Then when she arrived home, she was sitting at the table and does not recall what happened.  Her daughter at bedside states that it appeared Sheena Parker bent over to grab something on the ground but just kept falling slowly to the ground.  She is not sure if Sheena Parker completely lost consciousness or not.  She did not hit the ground hard as family was there to help her.  At this time, her dizziness is improving.  Earlier today and now, patient has not experienced any chest pain, shortness of breath, palpitations.  She does endorse generalized weakness that is all over with no focalizing.   Per chart review, patient was admitted on 10/05/2023 for left-sided chest pain with elevated troponin consistent with NSTEMI.  She was started on heparin  infusion and underwent LHC today with DES to RCA.  Given elevated blood pressure, concern for hypertensive urgency/emergency as well with improvement in blood pressure prior to discharge.    Discharge Diagnoses:  Principal Problem:   Syncope Active Problems:   CAD S/P percutaneous  coronary angioplasty   Hyponatremia   Essential hypertension   Diabetes (HCC)   * Syncope Patient is presenting after a syncopal episode at home preceded by dizziness.  This is in the setting of NSTEMI with LHC earlier today. On EKG, she is bradycardic although this appears to be chronic and unchanged.  Echocardiogram yesterday did not demonstrate any significant systolic dysfunction or valvular disease.  Hemodynamically stable at this time.  Plan: Maintained on telemetry overnight.  No arrhythmia noted.  Status post oral and IV hydration.  Symptoms improved at time of discharge.  Therapy services engaged.  Recommend home with home health PT.  Ordered on discharge.  DME walker and DME bedside commode added as well.  Stable for discharge home.  Follow-up outpatient PCP 1 to 2 weeks.  Follow-up outpatient cardiology 1 week.  CAD S/P percutaneous coronary angioplasty Patient was admitted on 09/08/2023 and underwent LHC earlier today with DES placed in the RCA.  No recurrence of chest pain and troponin downtrending   - Continue home DAPT, statin  Discharge Instructions  Discharge Instructions     Diet Carb Modified   Complete by: As directed    Increase activity slowly   Complete by: As directed       Allergies as of 09/11/2023   No Known Allergies      Medication List     TAKE these medications    acetaminophen  325 MG tablet Commonly known as: TYLENOL  Take 650 mg by mouth every 6 (six) hours as needed for mild pain or headache.   amLODipine  5 MG tablet Commonly known as: NORVASC  Take 1 tablet (  5 mg total) by mouth daily.   aspirin  EC 81 MG tablet Take 1 tablet (81 mg total) by mouth daily.   atorvastatin  80 MG tablet Commonly known as: LIPITOR Take 1 tablet (80 mg total) by mouth at bedtime.   Blood Glucose Monitoring Suppl Devi 1 each by Does not apply route in the morning, at noon, and at bedtime. May substitute to any manufacturer covered by patient's insurance.    gabapentin  100 MG capsule Commonly known as: NEURONTIN  Take 1 capsule (100 mg total) by mouth 3 (three) times daily.   glucose blood test strip DX E11.9  Check blood sugar daily What changed: Another medication with the same name was added. Make sure you understand how and when to take each.   BLOOD GLUCOSE TEST STRIPS Strp 1 each by In Vitro route in the morning, at noon, and at bedtime. May substitute to any manufacturer covered by patient's insurance. What changed: You were already taking a medication with the same name, and this prescription was added. Make sure you understand how and when to take each.   Lancet Device Misc 1 each by Does not apply route in the morning, at noon, and at bedtime. May substitute to any manufacturer covered by patient's insurance.   Lancets Misc. Misc 1 each by Does not apply route in the morning, at noon, and at bedtime. May substitute to any manufacturer covered by patient's insurance.   levothyroxine  50 MCG tablet Commonly known as: SYNTHROID  Take 1 tablet (50 mcg total) by mouth daily before breakfast.   linagliptin 5 MG Tabs tablet Commonly known as: Tradjenta Take 1 tablet (5 mg total) by mouth daily.   losartan  50 MG tablet Commonly known as: COZAAR  Take 1 tablet (50 mg total) by mouth 2 (two) times daily. What changed: when to take this   metFORMIN  1000 MG tablet Commonly known as: GLUCOPHAGE  Take 0.5 tablets (500 mg total) by mouth 2 (two) times daily. What changed: medication strength   prasugrel 10 MG Tabs tablet Commonly known as: EFFIENT Take 1 tablet (10 mg total) by mouth daily.   Vitamin D  125 MCG (5000 UT) Caps 1  Capsule daily               Durable Medical Equipment  (From admission, onward)           Start     Ordered   09/11/23 1432  For home use only DME Walker rolling  Once       Comments: RW walker with seat attachment  Question Answer Comment  Walker: With 5 Inch Wheels   Patient needs a  walker to treat with the following condition Weakness      09/11/23 1432   09/11/23 1234  For home use only DME Bedside commode  Once       Question:  Patient needs a bedside commode to treat with the following condition  Answer:  Weakness   09/11/23 1233            Follow-up Information     Barton Like, PA-C. Go in 1 week(s).   Why: Appointment scheduled for 5/13 at 9 AM Contact information: [address] [facility] [place] [address] [phone]                No Known Allergies  Consultations: Cardiology- [facility]   Procedures/Studies: ECHOCARDIOGRAM COMPLETE Result Date: 09/10/2023    ECHOCARDIOGRAM REPORT   Patient Name:   Sheena Parker Date of Exam: 09/09/2023  Medical Rec #:  [id number]    Height:       60.0 in Accession #:    [id number]   Weight:       141.0 lb Date of Birth:  [birth year]    BSA:          1.609 m Patient Age:    72 years     BP:           145/42 mmHg Patient Gender: F            HR:           51 bpm. Exam Location:  [facility] Procedure: 2D Echo, Cardiac Doppler and Color Doppler (Both Spectral and Color            Flow Doppler were utilized during procedure). Indications:     CHEST PAIN R07.9  History:         Patient has no prior history of Echocardiogram examinations.  Sonographer:     Jane Meager RDCS Referring Phys:  [id number] [facility] Diagnosing Phys: Joetta Mustache  Sonographer Comments: Image acquisition challenging due to respiratory motion. IMPRESSIONS  1. Left ventricular ejection fraction, by estimation, is 70 to 75%. The left ventricle has hyperdynamic function. The left ventricle has no regional wall motion abnormalities. There is mild left ventricular hypertrophy. Left ventricular diastolic parameters are consistent with Grade I diastolic dysfunction (impaired relaxation).  2. Right ventricular systolic function is normal. The right ventricular size is normal.  3. The mitral valve is normal in structure. Trivial mitral valve regurgitation.  Moderate mitral annular calcification.  4. The aortic valve is tricuspid. There is mild thickening of the aortic valve. Aortic valve regurgitation is not visualized.  5. The inferior vena cava is normal in size with greater than 50% respiratory variability, suggesting right atrial pressure of 3 mmHg. FINDINGS  Left Ventricle: Left ventricular ejection fraction, by estimation, is 70 to 75%. The left ventricle has hyperdynamic function. The left ventricle has no regional wall motion abnormalities. The left ventricular internal cavity size was normal in size. There is mild left ventricular hypertrophy. Left ventricular diastolic parameters are consistent with Grade I diastolic dysfunction (impaired relaxation). Right Ventricle: The right ventricular size is normal. No increase in right ventricular wall thickness. Right ventricular systolic function is normal. Left Atrium: Left atrial size was normal in size. Right Atrium: Right atrial size was normal in size. Pericardium: There is no evidence of pericardial effusion. Mitral Valve: The mitral valve is normal in structure. Moderate mitral annular calcification. Trivial mitral valve regurgitation. Tricuspid Valve: The tricuspid valve is normal in structure. Tricuspid valve regurgitation is trivial. Aortic Valve: The aortic valve is tricuspid. There is mild thickening of the aortic valve. Aortic valve regurgitation is not visualized. Aortic valve peak gradient measures 13.3 mmHg. Pulmonic Valve: The pulmonic valve was not well visualized. Pulmonic valve regurgitation is not visualized. Aorta: The aortic root and ascending aorta are structurally normal, with no evidence of dilitation. Venous: The inferior vena cava is normal in size with greater than 50% respiratory variability, suggesting right atrial pressure of 3 mmHg. IAS/Shunts: The atrial septum is grossly normal.  LEFT VENTRICLE PLAX 2D LVIDd:         3.80 cm   Diastology LVIDs:         2.50 cm   LV e' medial:     4.90 cm/s LV PW:         1.00 cm   LV E/e' medial:  18.2 LV  IVS:        1.10 cm   LV e' lateral:   8.70 cm/s LVOT diam:     2.00 cm   LV E/e' lateral: 10.2 LV SV:         118 LV SV Index:   73 LVOT Area:     3.14 cm  RIGHT VENTRICLE RV Basal diam:  2.60 cm RV S prime:     14.40 cm/s TAPSE (M-mode): 2.1 cm LEFT ATRIUM             Index        RIGHT ATRIUM           Index LA diam:        3.90 cm 2.42 cm/m   RA Area:     10.40 cm LA Vol (A2C):   42.2 ml 26.23 ml/m  RA Volume:   20.10 ml  12.49 ml/m LA Vol (A4C):   36.1 ml 22.44 ml/m LA Biplane Vol: 39.1 ml 24.30 ml/m  AORTIC VALVE                 PULMONIC VALVE AV Area (Vmax): 2.46 cm     PV Vmax:        1.17 m/s AV Vmax:        182.50 cm/s  PV Peak grad:   5.5 mmHg AV Peak Grad:   13.3 mmHg    RVOT Peak grad: 4 mmHg LVOT Vmax:      143.00 cm/s LVOT Vmean:     99.300 cm/s LVOT VTI:       0.376 m  AORTA Ao Root diam: 3.60 cm Ao Asc diam:  3.20 cm MITRAL VALVE                TRICUSPID VALVE MV Area (PHT): 2.30 cm     TR Peak grad:   4.6 mmHg MV Decel Time: 330 msec     TR Vmax:        107.00 cm/s MV E velocity: 89.10 cm/s MV A velocity: 159.00 cm/s  SHUNTS MV E/A ratio:  0.56         Systemic VTI:  0.38 m                             Systemic Diam: 2.00 cm Joetta Mustache Electronically signed by Joetta Mustache Signature Date/Time: 09/10/2023/11:55:13 AM    Final    CARDIAC CATHETERIZATION Result Date: 09/10/2023   Ost RCA to Prox RCA lesion is 90% stenosed.   Mid RCA lesion is 50% stenosed.   Dist RCA lesion is 40% stenosed.   Mid LAD lesion is 40% stenosed.   1st Mrg lesion is 60% stenosed.   1st Diag lesion is 60% stenosed.   Ost Cx lesion is 50% stenosed.   Prox Cx lesion is 50% stenosed.   A drug-eluting stent was successfully placed using a STENT ONYX FRONTIER 2.5X12.   Post intervention, there is a 0% residual stenosis.   The left ventricular systolic function is normal.   LV end diastolic pressure is normal.   The left ventricular ejection fraction is  55-65% by visual estimate. 1.  NSTEMI\ 2.  One-vessel coronary artery disease with 90% stenosis ostial RCA 3.  Normal left ventricular function 4.  Successful PCI with 2.5 x 12 mm Onyx frontier DES ostial RCA Recommendations 1.  Dual antiplatelet therapy (aspirin  and prasugrel) uninterrupted x 1 year 2.  Start high  intensity atorvastatin  80 mg daily 3.  May consider discharge later today 4.  Follow-up 1 to 2 weeks   DG Chest 2 View Result Date: 09/08/2023 CLINICAL DATA:  Chest pain EXAM: CHEST - 2 VIEW COMPARISON:  None Available. FINDINGS: Lungs are clear.  No pneumothorax. Heart size and mediastinal contours are within normal limits. Aortic Atherosclerosis (ICD10-170.0). No effusion. Visualized bones unremarkable.  Left carotid stent. IMPRESSION: No acute cardiopulmonary disease. Electronically Signed   By: Nicoletta Barrier M.D.   On: 09/08/2023 12:35      Subjective: Seen and examined on the day of discharge.  Stable no distress.  Appropriate for discharge home  Discharge Exam: Vitals:   09/11/23 1410 09/11/23 1415  BP:    Pulse: (!) 55 (!) 58  Resp: 18 17  Temp:    SpO2: 98% 99%   Vitals:   09/11/23 1400 09/11/23 1405 09/11/23 1410 09/11/23 1415  BP: (!) 120/48     Pulse: (!) 51 (!) 53 (!) 55 (!) 58  Resp: 18 18 18 17   Temp:      TempSrc:      SpO2: 100% 99% 98% 99%  Weight:      Height:        General: Pt is alert, awake, not in acute distress Cardiovascular: RRR, S1/S2 +, no rubs, no gallops Respiratory: CTA bilaterally, no wheezing, no rhonchi Abdominal: Soft, NT, ND, bowel sounds + Extremities: no edema, no cyanosis    The results of significant diagnostics from this hospitalization (including imaging, microbiology, ancillary and laboratory) are listed below for reference.     Microbiology: No results found for this or any previous visit (from the past 240 hours).   Labs: BNP (last 3 results) No results for input(s): "BNP" in the last 8760 hours. Basic Metabolic  Panel: Recent Labs  Lab 09/08/23 1215 09/08/23 1433 09/09/23 0520 09/10/23 0535 09/10/23 1644 09/11/23 0513  NA 129*  --  132* 131* 130* 131*  K 4.1  --  4.1 3.9 3.8 3.6  CL 97*  --  99 96* 96* 101  CO2 22  --  24 24 21* 23  GLUCOSE 190*  --  167* 164* 193* 144*  BUN 16  --  16 16 13 14   CREATININE 0.63  --  0.60 0.58 0.65 0.45  CALCIUM  9.1  --  9.1 8.8* 8.8* 8.5*  MG  --  1.8 1.9 2.0  --  2.0  PHOS  --  3.7 4.3 4.0  --   --    Liver Function Tests: Recent Labs  Lab 09/10/23 1644  AST 26  ALT 27  ALKPHOS 50  BILITOT 0.9  PROT 7.0  ALBUMIN 3.7   No results for input(s): "LIPASE", "AMYLASE" in the last 168 hours. No results for input(s): "AMMONIA" in the last 168 hours. CBC: Recent Labs  Lab 09/08/23 1215 09/09/23 0520 09/10/23 0535 09/10/23 1644 09/11/23 0513  WBC 7.7 9.3 7.3 8.3 8.9  NEUTROABS  --   --   --   --  6.4  HGB 13.3 13.0 13.0 13.4 12.1  HCT 38.5 37.8 37.5 39.2 34.6*  MCV 83.0 82.9 83.5 84.1 84.2  PLT 278 264 252 287 244   Cardiac Enzymes: Recent Labs  Lab 09/08/23 1215  CKTOTAL 162   BNP: Invalid input(s): "POCBNP" CBG: Recent Labs  Lab 09/10/23 2042 09/10/23 2329 09/11/23 0513 09/11/23 0754 09/11/23 1118  GLUCAP 259* 232* 155* 161* 208*   D-Dimer No results for input(s): "DDIMER" in  the last 72 hours. Hgb A1c No results for input(s): "HGBA1C" in the last 72 hours. Lipid Profile No results for input(s): "CHOL", "HDL", "LDLCALC", "TRIG", "CHOLHDL", "LDLDIRECT" in the last 72 hours. Thyroid  function studies No results for input(s): "TSH", "T4TOTAL", "T3FREE", "THYROIDAB" in the last 72 hours.  Invalid input(s): "FREET3" Anemia work up No results for input(s): "VITAMINB12", "FOLATE", "FERRITIN", "TIBC", "IRON", "RETICCTPCT" in the last 72 hours. Urinalysis    Component Value Date/Time   BILIRUBINUR neg 01/26/2021 1546   PROTEINUR Negative 01/26/2021 1546   UROBILINOGEN negative (A) 01/26/2021 1546   NITRITE neg 01/26/2021  1546   LEUKOCYTESUR Small (1+) (A) 01/26/2021 1546   Sepsis Labs Recent Labs  Lab 09/09/23 0520 09/10/23 0535 09/10/23 1644 09/11/23 0513  WBC 9.3 7.3 8.3 8.9   Microbiology No results found for this or any previous visit (from the past 240 hours).   Time coordinating discharge: Over 30 minutes  SIGNED:   Tiajuana Fluke, MD  [facility] 09/11/2023, 2:36 PM Pager   If 7PM-7AM, please contact night-coverage

## 2023-09-11 NOTE — ED Notes (Signed)
 72 yof lying supine in the bed alert and oriented x 4. The pt is warm, pink, and dry. The advised she needed to go to the restroom. The pt was ambulatory to the toilet with assistance.

## 2023-09-11 NOTE — TOC Initial Note (Addendum)
 Transition of Care [facility]) - Initial/Assessment Note    Patient Details  Name: Sheena Parker MRN: [id number] Date of Birth: July 26, 1950  Transition of Care [facility]) CM/SW Contact:    Arminda Landmark, RN Phone Number: 09/11/2023, 1:08 PM  Clinical Narrative:                 Met with pt and her daughter Sheena Parker. Pt had a cardiac stent placed yesterday and was sent home, then she got dizzy and fell this morning. She hasn't had a PCP for 2-3 months as her's retired. Discussed with pt and daughter and loaded resources into her AVS. They also have a female doctor in mind that speaks her same language ([place]) named Dr. Enzo Parker. Daughter is to call and see if she can accept her as a new pt. Pt needs a rolater and bedside commode, and requests a cane. She will have to purchase a cane private pay. Called [employer] and left a voicemail for Sheena Parker to please return my call. Patient would like DME delivered to her home. Daughter at [facility] is Sheena Parker @ [phone], and states can meet them at the home when delivering the DME. Sheena Parker returned the call and will deliver DME to the hospital and can help load it into the car. 1500: Pt Parker orders for [facility] PT. Patient Parker no preference and hasn't ever used HHC. Contacted [place]  at [facility] and they will accept her. Discussed with pt and her daughter and encouraged to get a PCP right away as they will need a PCP for further HHC orders and they verbalized understanding. [facility] contact info loaded into AVS.   Expected Discharge Plan: Home/Self Care Barriers to Discharge: Continued Medical Work up   Patient Goals and CMS Choice            Expected Discharge Plan and Services   Discharge Planning Services: CM Consult Post Acute Care Choice: Durable Medical Equipment (Rolater, bedside commode) Living arrangements for the past 2 months: Single Family Home                 DME Arranged: Bedside commode, Walker rolling with seat DME Agency: [employer] Date DME Agency  Contacted: 09/11/23 Time DME Agency Contacted: 1302 Representative spoke with at DME Agency: Sheena Parker            Prior Living Arrangements/Services Living arrangements for the past 2 months: Single Family Home Lives with:: Spouse, Adult Children (spouse and son live at her home) Patient language and need for interpreter reviewed:: Yes Do you feel safe going back to the place where you live?: Yes      Need for Family Participation in Patient Care: Yes (Comment) Care giver support system in place?: Yes (comment) Current home services:  (none) Criminal Activity/Legal Involvement Pertinent to Current Situation/Hospitalization: No - Comment as needed  Activities of Daily Living      Permission Sought/Granted                  Emotional Assessment Appearance:: Appears younger than stated age Attitude/Demeanor/Rapport: Gracious Affect (typically observed): Appropriate, Calm, Pleasant Orientation: : Oriented to Self, Oriented to Place, Oriented to  Time, Oriented to Situation Alcohol / Substance Use: Never Used    Admission diagnosis:  Syncope [R55] Patient Active Problem List   Diagnosis Date Noted   Syncope 09/10/2023   CAD S/P percutaneous coronary angioplasty 09/10/2023   Hyponatremia 09/10/2023   NSTEMI (non-ST elevated myocardial infarction) (HCC) 09/08/2023   Primary osteoarthritis of left knee  10/19/2020   Need for COVID-19 vaccine 02/22/2020   Carotid stenosis 09/26/2019   Essential hypertension 07/15/2019   Diabetes (HCC) 07/15/2019   Left carotid bruit 07/15/2019   PCP:  Theron Flavin, MD Pharmacy:   [facility] [address] [place] - [address] [place] [address] Phone: [phone] Fax: [phone]  [facility] [address] [place] [address] Phone: [phone] Fax: [phone]     Social Drivers of Health (SDOH) Social History: SDOH Screenings   Food Insecurity:  No Food Insecurity (09/09/2023)  Housing: Low Risk  (09/09/2023)  Transportation Needs: No Transportation Needs (09/09/2023)  Utilities: Not At Risk (09/09/2023)  Alcohol Screen: Low Risk  (04/21/2021)  Depression (PHQ2-9): Low Risk  (04/21/2021)  Financial Resource Strain: Low Risk  (05/03/2021)  Physical Activity: Insufficiently Active (04/21/2021)  Social Connections: Socially Integrated (09/09/2023)  Stress: No Stress Concern Present (04/21/2021)  Tobacco Use: Low Risk  (09/10/2023)   SDOH Interventions:     Readmission Risk Interventions     No data to display

## 2023-09-11 NOTE — Consult Note (Signed)
 [facility] CLINIC CARDIOLOGY CONSULT NOTE       Patient ID: Sheena Parker MRN: [id number] DOB/AGE: 72-Dec-1952 72 y.o.  Admit date: 09/10/2023 Referring Physician Dr. Ysidro Her Primary Physician Theron Flavin, MD  Primary Cardiologist Dr. Loetta Ringer Reason for Consultation syncope  HPI: Sheena Parker is a 73 y.o. female  with a past medical history of coronary artery disease, NSTEMI s/p 2.5 x 12 mm Onyx frontier to ostial RCA (09/10/2023), peripheral vascular disease s/p L ICA stent 10/2019, hypertension, hyperlipidemia, type II diabetes, peripheral neuropathy, hypothyroidism who presented to the ED on 09/10/2023 for syncope. Cardiology was consulted for further evaluation.   Patient was at [facility] on 09/10/23 for an outpatient LHC and received stent to ostial RCA. Patient had no cardiac concerns prior to discharge yesterday afternoon. Patient states she reported to ED yesterday evening due to feeling lightheaded. Patients family lowered patient to the floor and patient did not fall. Work up in the ED notable for Na 130, K 3.8, Cr 0.65, Hgb 13.4, plts 287. Troponins mildly elevated and trending flat 84 > 108 > 131 (this is lower from prior admission for NSTEMI on 05/05). EKG in ED with sinus bradycardia with no acute ischemic changes at 51 bpm. Patient received IVFs and resumed on home PO medications.   At the time of my evaluation this morning patient was resting comfortably in ED stretcher, with daughter on the phone. We discussed patients symptoms in further detail. Patient reports she felt dizzy and lightheaded prior to her syncopal episode and family was present. Patient endorses not eating and drinking well at home. Of note, patient was NPO prior to [facility] yesterday this is possibly related to hypovolemia. Patient states she is feeling well this morning. BP and HR are stable and no arrhthymias on tele this AM.   Review of systems complete and found to be negative unless listed above    Past Medical  History:  Diagnosis Date   Anemia 02/16/2020   Arthritis    KNEE, hands   Diabetes mellitus without complication (HCC)    Difficult intubation    Gall stones 10/31/2016   GERD (gastroesophageal reflux disease)    NO MEDS   Hyperlipidemia    Hypertension    Hypothyroidism    Presence of dental prosthetic device    Dental implants,  top - front 2 teeth   Thyroid  disease    Weight decrease 02/16/2020   Weight loss 02/16/2020    Past Surgical History:  Procedure Laterality Date   CAROTID PTA/STENT INTERVENTION Left 10/30/2019   Procedure: CAROTID PTA/STENT INTERVENTION;  Surgeon: Celso College, MD;  Location: [facility] INVASIVE CV LAB;  Service: Cardiovascular;  Laterality: Left;   CATARACT EXTRACTION W/PHACO Right 12/16/2019   Procedure: CATARACT EXTRACTION PHACO AND INTRAOCULAR LENS PLACEMENT (IOC) RIGHT DIABETIC 7.57 00:55.0;  Surgeon: Clair Crews, MD;  Location: [facility]ERY [facility];  Service: Ophthalmology;  Laterality: Right;  Diabetic - oral meds   CATARACT EXTRACTION W/PHACO Left 01/06/2020   Procedure: CATARACT EXTRACTION PHACO AND INTRAOCULAR LENS PLACEMENT (IOC) LEFT DIABETIC 7.12  00:44.5;  Surgeon: Clair Crews, MD;  Location: [facility];  Service: Ophthalmology;  Laterality: Left;   CHOLECYSTECTOMY N/A 11/14/2016   Procedure: LAPAROSCOPIC CHOLECYSTECTOMY WITH INTRAOPERATIVE CHOLANGIOGRAM;  Surgeon: Marshall Skeeter, MD;  Location: [facility] ORS;  Service: General;  Laterality: N/A;   COLONOSCOPY WITH PROPOFOL  N/A 03/17/2019   Procedure: COLONOSCOPY WITH PROPOFOL ;  Surgeon: Irby Mannan, MD;  Location: [facility] ENDOSCOPY;  Service: Endoscopy;  Laterality: N/A;  CORONARY STENT INTERVENTION N/A 09/10/2023   Procedure: CORONARY STENT INTERVENTION;  Surgeon: Percival Brace, MD;  Location: [facility] INVASIVE CV LAB;  Service: Cardiovascular;  Laterality: N/A;   LEFT HEART CATH AND CORONARY ANGIOGRAPHY N/A 09/10/2023   Procedure: LEFT HEART CATH AND CORONARY ANGIOGRAPHY;   Surgeon: Percival Brace, MD;  Location: [facility] INVASIVE CV LAB;  Service: Cardiovascular;  Laterality: N/A;    (Not in a hospital admission)  Social History   Socioeconomic History   Marital status: Married    Spouse name: Not on file   Number of children: Not on file   Years of education: Not on file   Highest education level: Not on file  Occupational History   Not on file  Tobacco Use   Smoking status: Never   Smokeless tobacco: Never  Vaping Use   Vaping status: Never Used  Substance and Sexual Activity   Alcohol use: No   Drug use: No   Sexual activity: Not Currently  Other Topics Concern   Not on file  Social History Narrative   Not on file   Social Drivers of Health   Financial Resource Strain: Low Risk  (05/03/2021)   Overall Financial Resource Strain (CARDIA)    Difficulty of Paying Living Expenses: Not hard at all  Food Insecurity: No Food Insecurity (09/09/2023)   Hunger Vital Sign    Worried About Running Out of Food in the Last Year: Never true    Ran Out of Food in the Last Year: Never true  Transportation Needs: No Transportation Needs (09/09/2023)   PRAPARE - Administrator, Civil Service (Medical): No    Lack of Transportation (Non-Medical): No  Physical Activity: Insufficiently Active (04/21/2021)   Exercise Vital Sign    Days of Exercise per Week: 4 days    Minutes of Exercise per Session: 20 min  Stress: No Stress Concern Present (04/21/2021)   [employer] of Occupational Health - Occupational Stress Questionnaire    Feeling of Stress : Not at all  Social Connections: Socially Integrated (09/09/2023)   Social Connection and Isolation Panel [NHANES]    Frequency of Communication with Friends and Family: More than three times a week    Frequency of Social Gatherings with Friends and Family: Twice a week    Attends Religious Services: More than 4 times per year    Active Member of [employer] or Organizations: Yes    Attends Tax inspector Meetings: More than 4 times per year    Marital Status: Married  Catering manager Violence: Not At Risk (09/09/2023)   Humiliation, Afraid, Rape, and Kick questionnaire    Fear of Current or Ex-Partner: No    Emotionally Abused: No    Physically Abused: No    Sexually Abused: No    Family History  Problem Relation Age of Onset   Breast cancer Mother 33   Breast cancer Sister 59   Breast cancer Sister 84   Breast cancer Sister 39   Colon cancer Neg Hx      Vitals:   09/11/23 0508 09/11/23 0630 09/11/23 0700 09/11/23 0755  BP:  (!) 117/46 (!) 132/52   Pulse:  (!) 50 (!) 53   Resp:  14 15   Temp: 98 F (36.7 C)   97.8 F (36.6 C)  TempSrc: Oral   Oral  SpO2:  98% 99%   Weight:      Height:        PHYSICAL EXAM General: well appearing  elderly female, well nourished, in no acute distress. HEENT: Normocephalic and atraumatic. Neck: No JVD.  Lungs: Normal respiratory effort on room air. Clear bilaterally to auscultation. No wheezes, crackles, rhonchi.  Heart: HRRR. Normal S1 and S2 without gallops or murmurs.  Abdomen: Non-distended appearing.  Msk: Normal strength and tone for age. Extremities: Warm and well perfused. No clubbing, cyanosis. No edema.  Neuro: Alert and oriented X 3. Psych: Answers questions appropriately.   Labs: Basic Metabolic Panel: Recent Labs    09/09/23 0520 09/10/23 0535 09/10/23 1644 09/11/23 0513  NA 132* 131* 130* 131*  K 4.1 3.9 3.8 3.6  CL 99 96* 96* 101  CO2 24 24 21* 23  GLUCOSE 167* 164* 193* 144*  BUN 16 16 13 14   CREATININE 0.60 0.58 0.65 0.45  CALCIUM  9.1 8.8* 8.8* 8.5*  MG 1.9 2.0  --  2.0  PHOS 4.3 4.0  --   --    Liver Function Tests: Recent Labs    09/10/23 1644  AST 26  ALT 27  ALKPHOS 50  BILITOT 0.9  PROT 7.0  ALBUMIN 3.7   No results for input(s): "LIPASE", "AMYLASE" in the last 72 hours. CBC: Recent Labs    09/10/23 1644 09/11/23 0513  WBC 8.3 8.9  NEUTROABS  --  6.4  HGB 13.4 12.1  HCT  39.2 34.6*  MCV 84.1 84.2  PLT 287 244   Cardiac Enzymes: Recent Labs    09/08/23 1215 09/08/23 1433 09/10/23 1644 09/10/23 2055 09/11/23 0111  CKTOTAL 162  --   --   --   --   TROPONINIHS 163*   < > 84* 108* 131*   < > = values in this interval not displayed.   BNP: No results for input(s): "BNP" in the last 72 hours. D-Dimer: No results for input(s): "DDIMER" in the last 72 hours. Hemoglobin A1C: Recent Labs    09/08/23 1215  HGBA1C 9.9*   Fasting Lipid Panel: Recent Labs    09/08/23 1433  CHOL 88  HDL 31*  LDLCALC 40  TRIG 85  CHOLHDL 2.8   Thyroid  Function Tests: Recent Labs    09/08/23 1433  TSH 6.907*   Anemia Panel: No results for input(s): "VITAMINB12", "FOLATE", "FERRITIN", "TIBC", "IRON", "RETICCTPCT" in the last 72 hours.   Radiology: ECHOCARDIOGRAM COMPLETE Result Date: 09/10/2023    ECHOCARDIOGRAM REPORT   Patient Name:   Sheena Parker Date of Exam: 09/09/2023 Medical Rec #:  [id number]    Height:       60.0 in Accession #:    [id number]   Weight:       141.0 lb Date of Birth:  07-19-1950    BSA:          1.609 m Patient Age:    72 years     BP:           145/42 mmHg Patient Gender: F            HR:           51 bpm. Exam Location:  [facility] Procedure: 2D Echo, Cardiac Doppler and Color Doppler (Both Spectral and Color            Flow Doppler were utilized during procedure). Indications:     CHEST PAIN R07.9  History:         Patient has no prior history of Echocardiogram examinations.  Sonographer:     Jane Meager RDCS Referring Phys:  [id number] [facility] Diagnosing Phys: Sheena Parker  Sonographer Comments: Image acquisition challenging due to respiratory motion. IMPRESSIONS  1. Left ventricular ejection fraction, by estimation, is 70 to 75%. The left ventricle has hyperdynamic function. The left ventricle has no regional wall motion abnormalities. There is mild left ventricular hypertrophy. Left ventricular diastolic parameters are consistent with Grade I  diastolic dysfunction (impaired relaxation).  2. Right ventricular systolic function is normal. The right ventricular size is normal.  3. The mitral valve is normal in structure. Trivial mitral valve regurgitation. Moderate mitral annular calcification.  4. The aortic valve is tricuspid. There is mild thickening of the aortic valve. Aortic valve regurgitation is not visualized.  5. The inferior vena cava is normal in size with greater than 50% respiratory variability, suggesting right atrial pressure of 3 mmHg. FINDINGS  Left Ventricle: Left ventricular ejection fraction, by estimation, is 70 to 75%. The left ventricle has hyperdynamic function. The left ventricle has no regional wall motion abnormalities. The left ventricular internal cavity size was normal in size. There is mild left ventricular hypertrophy. Left ventricular diastolic parameters are consistent with Grade I diastolic dysfunction (impaired relaxation). Right Ventricle: The right ventricular size is normal. No increase in right ventricular wall thickness. Right ventricular systolic function is normal. Left Atrium: Left atrial size was normal in size. Right Atrium: Right atrial size was normal in size. Pericardium: There is no evidence of pericardial effusion. Mitral Valve: The mitral valve is normal in structure. Moderate mitral annular calcification. Trivial mitral valve regurgitation. Tricuspid Valve: The tricuspid valve is normal in structure. Tricuspid valve regurgitation is trivial. Aortic Valve: The aortic valve is tricuspid. There is mild thickening of the aortic valve. Aortic valve regurgitation is not visualized. Aortic valve peak gradient measures 13.3 mmHg. Pulmonic Valve: The pulmonic valve was not well visualized. Pulmonic valve regurgitation is not visualized. Aorta: The aortic root and ascending aorta are structurally normal, with no evidence of dilitation. Venous: The inferior vena cava is normal in size with greater than 50%  respiratory variability, suggesting right atrial pressure of 3 mmHg. IAS/Shunts: The atrial septum is grossly normal.  LEFT VENTRICLE PLAX 2D LVIDd:         3.80 cm   Diastology LVIDs:         2.50 cm   LV e' medial:    4.90 cm/s LV PW:         1.00 cm   LV E/e' medial:  18.2 LV IVS:        1.10 cm   LV e' lateral:   8.70 cm/s LVOT diam:     2.00 cm   LV E/e' lateral: 10.2 LV SV:         118 LV SV Index:   73 LVOT Area:     3.14 cm  RIGHT VENTRICLE RV Basal diam:  2.60 cm RV S prime:     14.40 cm/s TAPSE (M-mode): 2.1 cm LEFT ATRIUM             Index        RIGHT ATRIUM           Index LA diam:        3.90 cm 2.42 cm/m   RA Area:     10.40 cm LA Vol (A2C):   42.2 ml 26.23 ml/m  RA Volume:   20.10 ml  12.49 ml/m LA Vol (A4C):   36.1 ml 22.44 ml/m LA Biplane Vol: 39.1 ml 24.30 ml/m  AORTIC VALVE  PULMONIC VALVE AV Area (Vmax): 2.46 cm     PV Vmax:        1.17 m/s AV Vmax:        182.50 cm/s  PV Peak grad:   5.5 mmHg AV Peak Grad:   13.3 mmHg    RVOT Peak grad: 4 mmHg LVOT Vmax:      143.00 cm/s LVOT Vmean:     99.300 cm/s LVOT VTI:       0.376 m  AORTA Ao Root diam: 3.60 cm Ao Asc diam:  3.20 cm MITRAL VALVE                TRICUSPID VALVE MV Area (PHT): 2.30 cm     TR Peak grad:   4.6 mmHg MV Decel Time: 330 msec     TR Vmax:        107.00 cm/s MV E velocity: 89.10 cm/s MV A velocity: 159.00 cm/s  SHUNTS MV E/A ratio:  0.56         Systemic VTI:  0.38 m                             Systemic Diam: 2.00 cm Sheena Parker Electronically signed by Sheena Parker Signature Date/Time: 09/10/2023/11:55:13 AM    Final    CARDIAC CATHETERIZATION Result Date: 09/10/2023   Ost RCA to Prox RCA lesion is 90% stenosed.   Mid RCA lesion is 50% stenosed.   Dist RCA lesion is 40% stenosed.   Mid LAD lesion is 40% stenosed.   1st Mrg lesion is 60% stenosed.   1st Diag lesion is 60% stenosed.   Ost Cx lesion is 50% stenosed.   Prox Cx lesion is 50% stenosed.   A drug-eluting stent was successfully placed using a  STENT ONYX FRONTIER 2.5X12.   Post intervention, there is a 0% residual stenosis.   The left ventricular systolic function is normal.   LV end diastolic pressure is normal.   The left ventricular ejection fraction is 55-65% by visual estimate. 1.  NSTEMI\ 2.  One-vessel coronary artery disease with 90% stenosis ostial RCA 3.  Normal left ventricular function 4.  Successful PCI with 2.5 x 12 mm Onyx frontier DES ostial RCA Recommendations 1.  Dual antiplatelet therapy (aspirin  and prasugrel) uninterrupted x 1 year 2.  Start high intensity atorvastatin  80 mg daily 3.  May consider discharge later today 4.  Follow-up 1 to 2 weeks   DG Chest 2 View Result Date: 09/08/2023 CLINICAL DATA:  Chest pain EXAM: CHEST - 2 VIEW COMPARISON:  None Available. FINDINGS: Lungs are clear.  No pneumothorax. Heart size and mediastinal contours are within normal limits. Aortic Atherosclerosis (ICD10-170.0). No effusion. Visualized bones unremarkable.  Left carotid stent. IMPRESSION: No acute cardiopulmonary disease. Electronically Signed   By: Sheena Parker M.D.   On: 09/08/2023 12:35    ECHO as above  TELEMETRY reviewed by me 09/11/2023: sinus bradycardia, rate 50s (no events on tele since admission)  EKG reviewed by me: sinus bradycardia, rate 51 bpm  Data reviewed by me 09/11/2023: last 24h vitals tele labs imaging I/O ED provider note, admission H&P  Principal Problem:   Syncope Active Problems:   Essential hypertension   Diabetes (HCC)   CAD S/P percutaneous coronary angioplasty   Hyponatremia    ASSESSMENT AND PLAN:  Makenah Leaders is a 73 y.o. female  with a past medical history of coronary artery disease, NSTEMI s/p 2.5  x 12 mm Onyx frontier to ostial RCA (09/10/2023), peripheral vascular disease s/p L ICA stent 10/2019, hypertension, hyperlipidemia, type II diabetes, peripheral neuropathy, hypothyroidism who presented to the ED on 09/10/2023 for syncope. Cardiology was consulted for further evaluation.   # Syncope   # CAD s/p 2.5 x 12 mm Onyx frontier to ostial RCA (09/10/23) # Hypertension # Hyperlipidemia Patient reports to ED on 5/5 after a syncopal episode. The morning of 5/5 patient underwent LHC with stent to ostia RCA. Patient remained NPO prior to procedure and pt reports poor PO intake at home. EKG in ED with sinus bradycardia with no acute ischemic changes. Patient has baseline bradycardia. Troponins mildly elevated and trending flat 84 > 108 > 131 (this is lower from prior admission for NSTEMI on 05/05). Hgb, BP, and HR are all stable this AM. -Continue home ASA 81 mg, prasugrel 10 mg, atorvastatin  80 mg daily.  -Continue home amlodipine  10 mg and losartan  50 mg daily. -Encourage increased p.o. intake.  Encourage patient to get up and walk around after she eats breakfast and drinks a good amount of water. -Patient not currently on any AV nodal blockers, would recommend avoiding in the future given baseline bradycardia.  Ok for discharge today from a cardiac perspective if she ambulates without symptoms. Will arrange for follow up in clinic with Dr. Parks Bollman in 1-2 weeks.    This patient's plan of care was discussed and created with Dr. Bob Burn and he is in agreement.  Signed: Hamp Levine, PA-C  09/11/2023, 8:30 AM [facility]

## 2023-09-11 NOTE — ED Notes (Signed)
 DME delivered.Out in w/c with RN to car, family present. Denies questions or needs, steady gait.

## 2023-09-11 NOTE — ED Notes (Signed)
 DM RN at [facility]

## 2023-09-13 DIAGNOSIS — Z48812 Encounter for surgical aftercare following surgery on the circulatory system: Secondary | ICD-10-CM | POA: Diagnosis not present

## 2023-09-13 DIAGNOSIS — I214 Non-ST elevation (NSTEMI) myocardial infarction: Secondary | ICD-10-CM | POA: Diagnosis not present

## 2023-09-13 DIAGNOSIS — M150 Primary generalized (osteo)arthritis: Secondary | ICD-10-CM | POA: Diagnosis not present

## 2023-09-13 DIAGNOSIS — N179 Acute kidney failure, unspecified: Secondary | ICD-10-CM | POA: Diagnosis not present

## 2023-09-13 DIAGNOSIS — E1142 Type 2 diabetes mellitus with diabetic polyneuropathy: Secondary | ICD-10-CM | POA: Diagnosis not present

## 2023-09-13 DIAGNOSIS — E871 Hypo-osmolality and hyponatremia: Secondary | ICD-10-CM | POA: Diagnosis not present

## 2023-09-13 DIAGNOSIS — E039 Hypothyroidism, unspecified: Secondary | ICD-10-CM | POA: Diagnosis not present

## 2023-09-13 DIAGNOSIS — I10 Essential (primary) hypertension: Secondary | ICD-10-CM | POA: Diagnosis not present

## 2023-09-13 DIAGNOSIS — I2510 Atherosclerotic heart disease of native coronary artery without angina pectoris: Secondary | ICD-10-CM | POA: Diagnosis not present

## 2023-09-18 ENCOUNTER — Ambulatory Visit
Admission: RE | Admit: 2023-09-18 | Discharge: 2023-09-18 | Disposition: A | Discharge status: Home / Self Care | Source: Ambulatory Visit | Attending: Physician Assistant | Admitting: Physician Assistant

## 2023-09-18 ENCOUNTER — Other Ambulatory Visit: Payer: Self-pay | Admitting: Physician Assistant

## 2023-09-18 DIAGNOSIS — M25531 Pain in right wrist: Secondary | ICD-10-CM | POA: Diagnosis not present

## 2023-09-18 DIAGNOSIS — I6523 Occlusion and stenosis of bilateral carotid arteries: Secondary | ICD-10-CM | POA: Diagnosis not present

## 2023-09-18 DIAGNOSIS — T81718A Complication of other artery following a procedure, not elsewhere classified, initial encounter: Secondary | ICD-10-CM | POA: Diagnosis not present

## 2023-09-18 DIAGNOSIS — Z9889 Other specified postprocedural states: Secondary | ICD-10-CM | POA: Insufficient documentation

## 2023-09-18 DIAGNOSIS — I729 Aneurysm of unspecified site: Secondary | ICD-10-CM

## 2023-09-18 DIAGNOSIS — E119 Type 2 diabetes mellitus without complications: Secondary | ICD-10-CM | POA: Diagnosis not present

## 2023-09-18 DIAGNOSIS — I2510 Atherosclerotic heart disease of native coronary artery without angina pectoris: Secondary | ICD-10-CM | POA: Diagnosis not present

## 2023-09-18 DIAGNOSIS — I10 Essential (primary) hypertension: Secondary | ICD-10-CM | POA: Diagnosis not present

## 2023-09-18 DIAGNOSIS — Z955 Presence of coronary angioplasty implant and graft: Secondary | ICD-10-CM | POA: Diagnosis not present

## 2023-09-18 DIAGNOSIS — I214 Non-ST elevation (NSTEMI) myocardial infarction: Secondary | ICD-10-CM | POA: Diagnosis not present

## 2023-09-18 DIAGNOSIS — R55 Syncope and collapse: Secondary | ICD-10-CM | POA: Diagnosis not present

## 2023-09-19 DIAGNOSIS — M150 Primary generalized (osteo)arthritis: Secondary | ICD-10-CM | POA: Diagnosis not present

## 2023-09-19 DIAGNOSIS — E1142 Type 2 diabetes mellitus with diabetic polyneuropathy: Secondary | ICD-10-CM | POA: Diagnosis not present

## 2023-09-19 DIAGNOSIS — I2510 Atherosclerotic heart disease of native coronary artery without angina pectoris: Secondary | ICD-10-CM | POA: Diagnosis not present

## 2023-09-19 DIAGNOSIS — E039 Hypothyroidism, unspecified: Secondary | ICD-10-CM | POA: Diagnosis not present

## 2023-09-19 DIAGNOSIS — I10 Essential (primary) hypertension: Secondary | ICD-10-CM | POA: Diagnosis not present

## 2023-09-19 DIAGNOSIS — E871 Hypo-osmolality and hyponatremia: Secondary | ICD-10-CM | POA: Diagnosis not present

## 2023-09-19 DIAGNOSIS — I214 Non-ST elevation (NSTEMI) myocardial infarction: Secondary | ICD-10-CM | POA: Diagnosis not present

## 2023-09-19 DIAGNOSIS — Z48812 Encounter for surgical aftercare following surgery on the circulatory system: Secondary | ICD-10-CM | POA: Diagnosis not present

## 2023-09-19 DIAGNOSIS — N179 Acute kidney failure, unspecified: Secondary | ICD-10-CM | POA: Diagnosis not present

## 2023-09-24 DIAGNOSIS — I214 Non-ST elevation (NSTEMI) myocardial infarction: Secondary | ICD-10-CM | POA: Diagnosis not present

## 2023-09-24 DIAGNOSIS — E039 Hypothyroidism, unspecified: Secondary | ICD-10-CM | POA: Diagnosis not present

## 2023-09-24 DIAGNOSIS — E871 Hypo-osmolality and hyponatremia: Secondary | ICD-10-CM | POA: Diagnosis not present

## 2023-09-24 DIAGNOSIS — N179 Acute kidney failure, unspecified: Secondary | ICD-10-CM | POA: Diagnosis not present

## 2023-09-24 DIAGNOSIS — Z48812 Encounter for surgical aftercare following surgery on the circulatory system: Secondary | ICD-10-CM | POA: Diagnosis not present

## 2023-09-24 DIAGNOSIS — M150 Primary generalized (osteo)arthritis: Secondary | ICD-10-CM | POA: Diagnosis not present

## 2023-09-24 DIAGNOSIS — I2510 Atherosclerotic heart disease of native coronary artery without angina pectoris: Secondary | ICD-10-CM | POA: Diagnosis not present

## 2023-09-24 DIAGNOSIS — E1142 Type 2 diabetes mellitus with diabetic polyneuropathy: Secondary | ICD-10-CM | POA: Diagnosis not present

## 2023-09-24 DIAGNOSIS — I10 Essential (primary) hypertension: Secondary | ICD-10-CM | POA: Diagnosis not present

## 2023-09-25 ENCOUNTER — Encounter (INDEPENDENT_AMBULATORY_CARE_PROVIDER_SITE_OTHER): Payer: Self-pay

## 2023-10-02 DIAGNOSIS — R55 Syncope and collapse: Secondary | ICD-10-CM | POA: Diagnosis not present

## 2023-10-03 DIAGNOSIS — Z0100 Encounter for examination of eyes and vision without abnormal findings: Secondary | ICD-10-CM | POA: Diagnosis not present

## 2023-10-10 DIAGNOSIS — E039 Hypothyroidism, unspecified: Secondary | ICD-10-CM | POA: Diagnosis not present

## 2023-10-10 DIAGNOSIS — Z0131 Encounter for examination of blood pressure with abnormal findings: Secondary | ICD-10-CM | POA: Diagnosis not present

## 2023-10-10 DIAGNOSIS — E871 Hypo-osmolality and hyponatremia: Secondary | ICD-10-CM | POA: Diagnosis not present

## 2023-10-10 DIAGNOSIS — I214 Non-ST elevation (NSTEMI) myocardial infarction: Secondary | ICD-10-CM | POA: Diagnosis not present

## 2023-10-10 DIAGNOSIS — M150 Primary generalized (osteo)arthritis: Secondary | ICD-10-CM | POA: Diagnosis not present

## 2023-10-10 DIAGNOSIS — Z1331 Encounter for screening for depression: Secondary | ICD-10-CM | POA: Diagnosis not present

## 2023-10-10 DIAGNOSIS — I6529 Occlusion and stenosis of unspecified carotid artery: Secondary | ICD-10-CM | POA: Diagnosis not present

## 2023-10-10 DIAGNOSIS — Z1389 Encounter for screening for other disorder: Secondary | ICD-10-CM | POA: Diagnosis not present

## 2023-10-10 DIAGNOSIS — I10 Essential (primary) hypertension: Secondary | ICD-10-CM | POA: Diagnosis not present

## 2023-10-10 DIAGNOSIS — Z0000 Encounter for general adult medical examination without abnormal findings: Secondary | ICD-10-CM | POA: Diagnosis not present

## 2023-10-10 DIAGNOSIS — E1165 Type 2 diabetes mellitus with hyperglycemia: Secondary | ICD-10-CM | POA: Diagnosis not present

## 2023-10-10 DIAGNOSIS — Z48812 Encounter for surgical aftercare following surgery on the circulatory system: Secondary | ICD-10-CM | POA: Diagnosis not present

## 2023-10-10 DIAGNOSIS — I2510 Atherosclerotic heart disease of native coronary artery without angina pectoris: Secondary | ICD-10-CM | POA: Diagnosis not present

## 2023-10-10 DIAGNOSIS — N179 Acute kidney failure, unspecified: Secondary | ICD-10-CM | POA: Diagnosis not present

## 2023-10-10 DIAGNOSIS — E1142 Type 2 diabetes mellitus with diabetic polyneuropathy: Secondary | ICD-10-CM | POA: Diagnosis not present

## 2023-10-10 DIAGNOSIS — I252 Old myocardial infarction: Secondary | ICD-10-CM | POA: Diagnosis not present

## 2023-10-13 DIAGNOSIS — E1142 Type 2 diabetes mellitus with diabetic polyneuropathy: Secondary | ICD-10-CM | POA: Diagnosis not present

## 2023-10-13 DIAGNOSIS — I214 Non-ST elevation (NSTEMI) myocardial infarction: Secondary | ICD-10-CM | POA: Diagnosis not present

## 2023-10-13 DIAGNOSIS — Z48812 Encounter for surgical aftercare following surgery on the circulatory system: Secondary | ICD-10-CM | POA: Diagnosis not present

## 2023-10-13 DIAGNOSIS — E871 Hypo-osmolality and hyponatremia: Secondary | ICD-10-CM | POA: Diagnosis not present

## 2023-10-13 DIAGNOSIS — I10 Essential (primary) hypertension: Secondary | ICD-10-CM | POA: Diagnosis not present

## 2023-10-13 DIAGNOSIS — E039 Hypothyroidism, unspecified: Secondary | ICD-10-CM | POA: Diagnosis not present

## 2023-10-13 DIAGNOSIS — M150 Primary generalized (osteo)arthritis: Secondary | ICD-10-CM | POA: Diagnosis not present

## 2023-10-13 DIAGNOSIS — I2510 Atherosclerotic heart disease of native coronary artery without angina pectoris: Secondary | ICD-10-CM | POA: Diagnosis not present

## 2023-10-13 DIAGNOSIS — N179 Acute kidney failure, unspecified: Secondary | ICD-10-CM | POA: Diagnosis not present

## 2023-10-30 DIAGNOSIS — M150 Primary generalized (osteo)arthritis: Secondary | ICD-10-CM | POA: Diagnosis not present

## 2023-10-30 DIAGNOSIS — I10 Essential (primary) hypertension: Secondary | ICD-10-CM | POA: Diagnosis not present

## 2023-10-30 DIAGNOSIS — I214 Non-ST elevation (NSTEMI) myocardial infarction: Secondary | ICD-10-CM | POA: Diagnosis not present

## 2023-10-30 DIAGNOSIS — E1142 Type 2 diabetes mellitus with diabetic polyneuropathy: Secondary | ICD-10-CM | POA: Diagnosis not present

## 2023-10-30 DIAGNOSIS — Z48812 Encounter for surgical aftercare following surgery on the circulatory system: Secondary | ICD-10-CM | POA: Diagnosis not present

## 2023-10-30 DIAGNOSIS — E039 Hypothyroidism, unspecified: Secondary | ICD-10-CM | POA: Diagnosis not present

## 2023-10-30 DIAGNOSIS — N179 Acute kidney failure, unspecified: Secondary | ICD-10-CM | POA: Diagnosis not present

## 2023-10-30 DIAGNOSIS — I2510 Atherosclerotic heart disease of native coronary artery without angina pectoris: Secondary | ICD-10-CM | POA: Diagnosis not present

## 2023-10-30 DIAGNOSIS — E871 Hypo-osmolality and hyponatremia: Secondary | ICD-10-CM | POA: Diagnosis not present

## 2023-11-06 DIAGNOSIS — Z48812 Encounter for surgical aftercare following surgery on the circulatory system: Secondary | ICD-10-CM | POA: Diagnosis not present

## 2023-11-06 DIAGNOSIS — E871 Hypo-osmolality and hyponatremia: Secondary | ICD-10-CM | POA: Diagnosis not present

## 2023-11-06 DIAGNOSIS — M150 Primary generalized (osteo)arthritis: Secondary | ICD-10-CM | POA: Diagnosis not present

## 2023-11-06 DIAGNOSIS — E1142 Type 2 diabetes mellitus with diabetic polyneuropathy: Secondary | ICD-10-CM | POA: Diagnosis not present

## 2023-11-06 DIAGNOSIS — I10 Essential (primary) hypertension: Secondary | ICD-10-CM | POA: Diagnosis not present

## 2023-11-06 DIAGNOSIS — N179 Acute kidney failure, unspecified: Secondary | ICD-10-CM | POA: Diagnosis not present

## 2023-11-06 DIAGNOSIS — I214 Non-ST elevation (NSTEMI) myocardial infarction: Secondary | ICD-10-CM | POA: Diagnosis not present

## 2023-11-06 DIAGNOSIS — I2510 Atherosclerotic heart disease of native coronary artery without angina pectoris: Secondary | ICD-10-CM | POA: Diagnosis not present

## 2023-11-06 DIAGNOSIS — E039 Hypothyroidism, unspecified: Secondary | ICD-10-CM | POA: Diagnosis not present

## 2023-11-06 NOTE — Progress Notes (Signed)
 [facility] CLINIC CARDIOLOGY PROGRESS NOTE   Patient ID: Sheena Parker MRN: [id number] DOB/AGE: 1950-08-30 73 y.o.  Admit date: 09/08/2023 Referring Physician Dr. Elvan Sor Primary Physician Flock King, MD  Primary Cardiologist Dr. Flock Reason for Consultation NSTEMI  HPI: Sheena Parker is a 73 y.o. female with a past medical history of peripheral vascular disease s/p L ICA stent 10/2019, hypertension, hyperlipidemia, type II diabetes, peripheral neuropathy, hypothyroidism who presented to the ED on 09/08/2023 for chest pain. Found to have elevated troponins. Cardiology consulted for further evaluation.   Interval History:  -Patient seen and examined this afternoon following LHC.  -Reports she is feeling much better, denies any recurrence of chest pain symptoms.  -Reviewed echo and cath results with patient.  -BP and HR remain stable.   Review of systems complete and found to be negative unless listed above    Vitals:   09/10/23 1315 09/10/23 1330 09/10/23 1345 09/10/23 1424  BP: (!) 161/70 (!) 162/69 (!) 165/56 (!) 172/67  Pulse: (!) 48 (!) 53 (!) 48 (!) 53  Resp: 19 20 16 16   Temp:   97.9 F (36.6 C) 98.1 F (36.7 C)  TempSrc:   Oral Oral  SpO2: 98% 99% 98% 99%  Weight:      Height:        No intake or output data in the 24 hours ending 11/06/23 1430    PHYSICAL EXAM General: Well appearing female, well nourished, in no acute distress. HEENT: Normocephalic and atraumatic. Neck: No JVD.  Lungs: Normal respiratory effort on room air. Clear bilaterally to auscultation. No wheezes, crackles, rhonchi.  Heart: HRRR. Normal S1 and S2 without gallops or murmurs. Radial & DP pulses 2+ bilaterally. Abdomen: Non-distended appearing.  Msk: Normal strength and tone for age. Extremities: No clubbing, cyanosis or edema.   Neuro: Alert and oriented X 3. Psych: Mood appropriate, affect congruent.    LABS: Basic Metabolic Panel: No results for input(s): NA, K, CL, CO2,  GLUCOSE, BUN, CREATININE, CALCIUM , MG, PHOS in the last 72 hours.  Liver Function Tests: No results for input(s): AST, ALT, ALKPHOS, BILITOT, PROT, ALBUMIN in the last 72 hours. No results for input(s): LIPASE, AMYLASE in the last 72 hours. CBC: No results for input(s): WBC, NEUTROABS, HGB, HCT, MCV, PLT in the last 72 hours.  Cardiac Enzymes: No results for input(s): CKTOTAL, CKMB, CKMBINDEX, TROPONINIHS in the last 72 hours.  BNP: No results for input(s): BNP in the last 72 hours. D-Dimer: No results for input(s): DDIMER in the last 72 hours. Hemoglobin A1C: No results for input(s): HGBA1C in the last 72 hours.  Fasting Lipid Panel: No results for input(s): CHOL, HDL, LDLCALC, TRIG, CHOLHDL, LDLDIRECT in the last 72 hours.  Thyroid  Function Tests: No results for input(s): TSH, T4TOTAL, T3FREE, THYROIDAB in the last 72 hours.  Invalid input(s): FREET3  Anemia Panel: No results for input(s): VITAMINB12, FOLATE, FERRITIN, TIBC, IRON, RETICCTPCT in the last 72 hours.  No results found.    ECHO as above  TELEMETRY reviewed by me 11/06/23: sinus bradycardia rate 50s  EKG reviewed by me 11/06/23: Sinus bradycardia 56 bpm  DATA reviewed by me 11/06/23: last 24h vitals tele labs imaging I/O, hospitalist progress note, cath report  Principal Problem:   NSTEMI (non-ST elevated myocardial infarction) (HCC)    ASSESSMENT AND PLAN: Sheena Parker is a 72 y.o. female with a past medical history of peripheral vascular disease s/p L ICA stent 10/2019, hypertension, hyperlipidemia, type II diabetes, peripheral neuropathy, hypothyroidism who presented  to the ED on 09/08/2023 for chest pain. Found to have elevated troponins. Cardiology consulted for further evaluation.   # NSTEMI # Hypertension # Hyperlipidemia Patient presented with complaints of chest pain, troponins found to be elevated trending 163  > 159 > 192 > 203. EKG without acute ischemic changes. Started on IV heparin  in the ED. Echo this admission with EF 70-75%, no WMAs, mild LVH and grade I diastolic dysfunction. LHC this AM with 90% ostial RCA lesion s/p 2.5 x 12 mm Onyx frontier. -Continue aspirin  81 mg daily and effient  10 mg daily. Plan for DAPT uninterrupted x12 months.  -Increase atorvastatin  80 mg daily.  -Continue amlodipine  10 mg daily and losartan  50 mg daily. Defer beta blocker given baseline bradycardia.   Ok for discharge today from a cardiac perspective. Will arrange for follow up in clinic with Dr. Ammon in 1-2 weeks.    This patient's case was discussed and created with Dr. Wilburn and he is in agreement.  Signed:  Danita Bloch, PA-C  11/06/2023, 2:30 PM [facility] Cardiology

## 2023-11-14 DIAGNOSIS — E1165 Type 2 diabetes mellitus with hyperglycemia: Secondary | ICD-10-CM | POA: Diagnosis not present

## 2023-11-14 DIAGNOSIS — Z0130 Encounter for examination of blood pressure without abnormal findings: Secondary | ICD-10-CM | POA: Diagnosis not present

## 2023-11-14 DIAGNOSIS — E039 Hypothyroidism, unspecified: Secondary | ICD-10-CM | POA: Diagnosis not present

## 2023-11-14 DIAGNOSIS — I252 Old myocardial infarction: Secondary | ICD-10-CM | POA: Diagnosis not present

## 2023-11-14 DIAGNOSIS — Z0131 Encounter for examination of blood pressure with abnormal findings: Secondary | ICD-10-CM | POA: Diagnosis not present

## 2023-11-14 DIAGNOSIS — Z1389 Encounter for screening for other disorder: Secondary | ICD-10-CM | POA: Diagnosis not present

## 2023-11-14 DIAGNOSIS — I10 Essential (primary) hypertension: Secondary | ICD-10-CM | POA: Diagnosis not present

## 2023-11-14 DIAGNOSIS — Z0000 Encounter for general adult medical examination without abnormal findings: Secondary | ICD-10-CM | POA: Diagnosis not present

## 2023-11-15 ENCOUNTER — Other Ambulatory Visit: Payer: Self-pay

## 2023-11-15 DIAGNOSIS — Z780 Asymptomatic menopausal state: Secondary | ICD-10-CM

## 2023-11-28 DIAGNOSIS — I252 Old myocardial infarction: Secondary | ICD-10-CM | POA: Diagnosis not present

## 2023-11-28 DIAGNOSIS — I6529 Occlusion and stenosis of unspecified carotid artery: Secondary | ICD-10-CM | POA: Diagnosis not present

## 2023-11-28 DIAGNOSIS — I2510 Atherosclerotic heart disease of native coronary artery without angina pectoris: Secondary | ICD-10-CM | POA: Diagnosis not present

## 2023-11-28 DIAGNOSIS — Z955 Presence of coronary angioplasty implant and graft: Secondary | ICD-10-CM | POA: Diagnosis not present

## 2023-11-28 DIAGNOSIS — I10 Essential (primary) hypertension: Secondary | ICD-10-CM | POA: Diagnosis not present

## 2023-11-28 DIAGNOSIS — E1140 Type 2 diabetes mellitus with diabetic neuropathy, unspecified: Secondary | ICD-10-CM | POA: Diagnosis not present

## 2023-11-28 DIAGNOSIS — E039 Hypothyroidism, unspecified: Secondary | ICD-10-CM | POA: Diagnosis not present

## 2023-11-28 DIAGNOSIS — Z0000 Encounter for general adult medical examination without abnormal findings: Secondary | ICD-10-CM | POA: Diagnosis not present

## 2023-11-28 DIAGNOSIS — Z1331 Encounter for screening for depression: Secondary | ICD-10-CM | POA: Diagnosis not present

## 2023-12-31 ENCOUNTER — Encounter

## 2024-01-18 DIAGNOSIS — Z23 Encounter for immunization: Secondary | ICD-10-CM | POA: Diagnosis not present

## 2024-02-28 DIAGNOSIS — I252 Old myocardial infarction: Secondary | ICD-10-CM | POA: Diagnosis not present

## 2024-02-28 DIAGNOSIS — E039 Hypothyroidism, unspecified: Secondary | ICD-10-CM | POA: Diagnosis not present

## 2024-02-28 DIAGNOSIS — I10 Essential (primary) hypertension: Secondary | ICD-10-CM | POA: Diagnosis not present

## 2024-02-28 DIAGNOSIS — Z0131 Encounter for examination of blood pressure with abnormal findings: Secondary | ICD-10-CM | POA: Diagnosis not present

## 2024-02-28 DIAGNOSIS — E1165 Type 2 diabetes mellitus with hyperglycemia: Secondary | ICD-10-CM | POA: Diagnosis not present

## 2024-02-28 DIAGNOSIS — Z0000 Encounter for general adult medical examination without abnormal findings: Secondary | ICD-10-CM | POA: Diagnosis not present

## 2024-02-28 DIAGNOSIS — Z1389 Encounter for screening for other disorder: Secondary | ICD-10-CM | POA: Diagnosis not present

## 2024-02-28 DIAGNOSIS — Z23 Encounter for immunization: Secondary | ICD-10-CM | POA: Diagnosis not present

## 2024-02-28 DIAGNOSIS — Z0130 Encounter for examination of blood pressure without abnormal findings: Secondary | ICD-10-CM | POA: Diagnosis not present

## 2024-03-11 DIAGNOSIS — Z712 Person consulting for explanation of examination or test findings: Secondary | ICD-10-CM | POA: Diagnosis not present

## 2024-03-11 DIAGNOSIS — Z0130 Encounter for examination of blood pressure without abnormal findings: Secondary | ICD-10-CM | POA: Diagnosis not present

## 2024-03-11 DIAGNOSIS — E785 Hyperlipidemia, unspecified: Secondary | ICD-10-CM | POA: Diagnosis not present

## 2024-03-11 DIAGNOSIS — I252 Old myocardial infarction: Secondary | ICD-10-CM | POA: Diagnosis not present

## 2024-03-11 DIAGNOSIS — Z0131 Encounter for examination of blood pressure with abnormal findings: Secondary | ICD-10-CM | POA: Diagnosis not present

## 2024-03-11 DIAGNOSIS — Z1389 Encounter for screening for other disorder: Secondary | ICD-10-CM | POA: Diagnosis not present

## 2024-03-18 DIAGNOSIS — Z955 Presence of coronary angioplasty implant and graft: Secondary | ICD-10-CM | POA: Diagnosis not present

## 2024-03-18 DIAGNOSIS — I10 Essential (primary) hypertension: Secondary | ICD-10-CM | POA: Diagnosis not present

## 2024-03-18 DIAGNOSIS — E039 Hypothyroidism, unspecified: Secondary | ICD-10-CM | POA: Diagnosis not present

## 2024-03-18 DIAGNOSIS — E1140 Type 2 diabetes mellitus with diabetic neuropathy, unspecified: Secondary | ICD-10-CM | POA: Diagnosis not present

## 2024-03-18 DIAGNOSIS — Z0000 Encounter for general adult medical examination without abnormal findings: Secondary | ICD-10-CM | POA: Diagnosis not present

## 2024-03-25 DIAGNOSIS — I214 Non-ST elevation (NSTEMI) myocardial infarction: Secondary | ICD-10-CM | POA: Diagnosis not present

## 2024-03-25 DIAGNOSIS — Z7689 Persons encountering health services in other specified circumstances: Secondary | ICD-10-CM | POA: Diagnosis not present

## 2024-03-25 DIAGNOSIS — E039 Hypothyroidism, unspecified: Secondary | ICD-10-CM | POA: Diagnosis not present

## 2024-03-25 DIAGNOSIS — Z955 Presence of coronary angioplasty implant and graft: Secondary | ICD-10-CM | POA: Diagnosis not present

## 2024-03-25 DIAGNOSIS — I2510 Atherosclerotic heart disease of native coronary artery without angina pectoris: Secondary | ICD-10-CM | POA: Diagnosis not present

## 2024-03-25 DIAGNOSIS — R8290 Unspecified abnormal findings in urine: Secondary | ICD-10-CM | POA: Diagnosis not present

## 2024-03-25 DIAGNOSIS — I6523 Occlusion and stenosis of bilateral carotid arteries: Secondary | ICD-10-CM | POA: Diagnosis not present

## 2024-03-25 DIAGNOSIS — E1165 Type 2 diabetes mellitus with hyperglycemia: Secondary | ICD-10-CM | POA: Diagnosis not present

## 2024-03-25 DIAGNOSIS — I10 Essential (primary) hypertension: Secondary | ICD-10-CM | POA: Diagnosis not present

## 2024-04-11 DIAGNOSIS — Z1389 Encounter for screening for other disorder: Secondary | ICD-10-CM | POA: Diagnosis not present

## 2024-04-11 DIAGNOSIS — E039 Hypothyroidism, unspecified: Secondary | ICD-10-CM | POA: Diagnosis not present

## 2024-04-11 DIAGNOSIS — Z712 Person consulting for explanation of examination or test findings: Secondary | ICD-10-CM | POA: Diagnosis not present

## 2024-04-11 DIAGNOSIS — Z0131 Encounter for examination of blood pressure with abnormal findings: Secondary | ICD-10-CM | POA: Diagnosis not present

## 2024-04-11 DIAGNOSIS — Z0130 Encounter for examination of blood pressure without abnormal findings: Secondary | ICD-10-CM | POA: Diagnosis not present

## 2024-04-11 DIAGNOSIS — M25561 Pain in right knee: Secondary | ICD-10-CM | POA: Diagnosis not present

## 2024-04-11 DIAGNOSIS — E1165 Type 2 diabetes mellitus with hyperglycemia: Secondary | ICD-10-CM | POA: Diagnosis not present

## 2024-04-11 DIAGNOSIS — I10 Essential (primary) hypertension: Secondary | ICD-10-CM | POA: Diagnosis not present

## 2024-05-13 DIAGNOSIS — Z1231 Encounter for screening mammogram for malignant neoplasm of breast: Secondary | ICD-10-CM

## 2024-05-13 DIAGNOSIS — Z780 Asymptomatic menopausal state: Secondary | ICD-10-CM

## 2024-05-20 ENCOUNTER — Ambulatory Visit: Attending: Cardiovascular Disease | Admitting: Cardiovascular Disease

## 2024-05-20 VITALS — BP 153/63 | HR 54 | Ht 60.0 in | Wt 135.0 lb

## 2024-05-20 DIAGNOSIS — I10 Essential (primary) hypertension: Secondary | ICD-10-CM

## 2024-05-20 DIAGNOSIS — E785 Hyperlipidemia, unspecified: Secondary | ICD-10-CM

## 2024-05-20 DIAGNOSIS — I2510 Atherosclerotic heart disease of native coronary artery without angina pectoris: Secondary | ICD-10-CM | POA: Diagnosis not present

## 2024-05-20 DIAGNOSIS — I779 Disorder of arteries and arterioles, unspecified: Secondary | ICD-10-CM | POA: Diagnosis not present

## 2024-05-20 NOTE — Progress Notes (Signed)
 "    Cardiology Office Note   Date:  05/20/2024   ID:  Sheena Parker, DOB [birth year], MRN [id number]  PCP:  Sadie Manna, MD  Cardiologist:   Deatrice Cage, MD   Chief Complaint  Patient presents with   Establish Care    Bilateral leg cramps, Hypertension       History of Present Illness: Sheena Parker is a 74 y.o. female who presents to establish cardiovascular care regarding coronary artery disease. She has history of essential hypertension, type 2 diabetes, carotid stenosis status post left ICA stent in 2021 and hyperlipidemia. She was seen by [facility] cardiology in 2025 after she presented with chest pain radiating to her neck.  Troponin peaked at 203.  She underwent cardiac catheterization which showed 90% ostial/proximal RCA stenosis which was treated with PCI and drug-eluting stent placement.  Echocardiogram showed hyperdynamic LV systolic function.  She had presyncope after discharge and was brought back to the hospital.  EKG showed bradycardia which was chronic. She has been doing well with no chest pain, shortness of breath or palpitations.  She takes her medications regularly.   Past Medical History:  Diagnosis Date   Anemia 02/16/2020   Arthritis    KNEE, hands   Diabetes mellitus without complication (HCC)    Difficult intubation    Gall stones 10/31/2016   GERD (gastroesophageal reflux disease)    NO MEDS   Hyperlipidemia    Hypertension    Hypothyroidism    Presence of dental prosthetic device    Dental implants,  top - front 2 teeth   Thyroid  disease    Weight decrease 02/16/2020   Weight loss 02/16/2020    Past Surgical History:  Procedure Laterality Date   CAROTID PTA/STENT INTERVENTION Left 10/30/2019   Procedure: CAROTID PTA/STENT INTERVENTION;  Surgeon: Marea Selinda RAMAN, MD;  Location: [facility] INVASIVE CV LAB;  Service: Cardiovascular;  Laterality: Left;   CATARACT EXTRACTION W/PHACO Right 12/16/2019   Procedure: CATARACT EXTRACTION PHACO AND INTRAOCULAR LENS  PLACEMENT (IOC) RIGHT DIABETIC 7.57 00:55.0;  Surgeon: Jaye Fallow, MD;  Location: [facility];  Service: Ophthalmology;  Laterality: Right;  Diabetic - oral meds   CATARACT EXTRACTION W/PHACO Left 01/06/2020   Procedure: CATARACT EXTRACTION PHACO AND INTRAOCULAR LENS PLACEMENT (IOC) LEFT DIABETIC 7.12  00:44.5;  Surgeon: Jaye Fallow, MD;  Location: [facility];  Service: Ophthalmology;  Laterality: Left;   CHOLECYSTECTOMY N/A 11/14/2016   Procedure: LAPAROSCOPIC CHOLECYSTECTOMY WITH INTRAOPERATIVE CHOLANGIOGRAM;  Surgeon: Dessa Reyes ORN, MD;  Location: [facility] ORS;  Service: General;  Laterality: N/A;   COLONOSCOPY WITH PROPOFOL  N/A 03/17/2019   Procedure: COLONOSCOPY WITH PROPOFOL ;  Surgeon: Janalyn Keene NOVAK, MD;  Location: [facility] ENDOSCOPY;  Service: Endoscopy;  Laterality: N/A;   CORONARY STENT INTERVENTION N/A 09/10/2023   Procedure: CORONARY STENT INTERVENTION;  Surgeon: Ammon Blunt, MD;  Location: [facility] INVASIVE CV LAB;  Service: Cardiovascular;  Laterality: N/A;   LEFT HEART CATH AND CORONARY ANGIOGRAPHY N/A 09/10/2023   Procedure: LEFT HEART CATH AND CORONARY ANGIOGRAPHY;  Surgeon: Ammon Blunt, MD;  Location: [facility] INVASIVE CV LAB;  Service: Cardiovascular;  Laterality: N/A;     Current Outpatient Medications  Medication Sig Dispense Refill   acetaminophen  (TYLENOL ) 325 MG tablet Take 650 mg by mouth every 6 (six) hours as needed for mild pain or headache.      amLODipine  (NORVASC ) 5 MG tablet Take 1 tablet (5 mg total) by mouth daily. 90 tablet 3   aspirin  EC 81 MG tablet Take 1 tablet (  81 mg total) by mouth daily. 30 tablet 11   atorvastatin  (LIPITOR ) 80 MG tablet Take 1 tablet (80 mg total) by mouth at bedtime. 30 tablet 11   Blood Glucose Monitoring Suppl DEVI 1 each by Does not apply route in the morning, at noon, and at bedtime. May substitute to any manufacturer covered by patient's insurance. 1 each 0   Cholecalciferol (VITAMIN D ) 125 MCG  (5000 UT) CAPS 1  Capsule daily 30 capsule 11   gabapentin  (NEURONTIN ) 100 MG capsule Take 1 capsule (100 mg total) by mouth 3 (three) times daily. 90 capsule 3   levothyroxine  (SYNTHROID ) 50 MCG tablet Take 1 tablet (50 mcg total) by mouth daily before breakfast. 90 tablet 3   linagliptin  (TRADJENTA ) 5 MG TABS tablet Take 1 tablet (5 mg total) by mouth daily. 30 tablet 0   losartan  (COZAAR ) 50 MG tablet Take 1 tablet (50 mg total) by mouth 2 (two) times daily. (Patient taking differently: Take 50 mg by mouth daily.) 180 tablet 1   metFORMIN  (GLUCOPHAGE ) 1000 MG tablet Take 0.5 tablets (500 mg total) by mouth 2 (two) times daily. 30 tablet 0   prasugrel  (EFFIENT ) 10 MG TABS tablet Take 1 tablet (10 mg total) by mouth daily. 30 tablet 11   glucose blood test strip DX E11.9  Check blood sugar daily 100 each 12   No current facility-administered medications for this visit.    Allergies:   Patient has no known allergies.    Social History:  The patient  reports that she has never smoked. She has never used smokeless tobacco. She reports that she does not drink alcohol and does not use drugs.   Family History:  The patient's family history includes Breast cancer (age of onset: 18) in her mother, sister, sister, and sister.    ROS:  Please see the history of present illness.   Otherwise, review of systems are positive for none.   All other systems are reviewed and negative.    PHYSICAL EXAM: VS:  BP (!) 153/63 (BP Location: Left Arm, Patient Position: Sitting, Cuff Size: Normal)   Pulse (!) 54   Ht 5' (1.524 m)   Wt 135 lb (61.2 kg)   SpO2 99%   BMI 26.37 kg/m  , BMI Body mass index is 26.37 kg/m. GEN: Well nourished, well developed, in no acute distress  HEENT: normal  Neck: no JVD,or masses.  Bilateral carotid bruits Cardiac: RRR; no rubs, or gallops,no edema .  3 out of 6 systolic murmur at the left sternal border and the base of the heart Respiratory:  clear to auscultation  bilaterally, normal work of breathing GI: soft, nontender, nondistended, + BS MS: no deformity or atrophy  Skin: warm and dry, no rash Neuro:  Strength and sensation are intact Psych: euthymic mood, full affect   EKG:  EKG is ordered today. The ekg ordered today demonstrates : Sinus bradycardia       Recent Labs: 09/08/2023: TSH 6.907 09/10/2023: ALT 27 09/11/2023: BUN 14; Creatinine, Ser 0.45; Hemoglobin 12.1; Magnesium  2.0; Platelets 244; Potassium 3.6; Sodium 131    Lipid Panel    Component Value Date/Time   CHOL 88 09/08/2023 1433   TRIG 85 09/08/2023 1433   HDL 31 (L) 09/08/2023 1433   CHOLHDL 2.8 09/08/2023 1433   VLDL 17 09/08/2023 1433   LDLCALC 40 09/08/2023 1433   LDLCALC 51 09/17/2020 0918      Wt Readings from Last 3 Encounters:  05/20/24 135 lb (  61.2 kg)  09/10/23 144 lb (65.3 kg)  09/10/23 141 lb 1.5 oz (64 kg)           No data to display            ASSESSMENT AND PLAN:  1.  Coronary artery disease involving native coronary arteries without angina: She had non-STEMI in May 2025 with subsequent drug-eluting stent placement to the right coronary artery.  Continue prasugrel  until May 2026.  After that, we have the option of stopping the medication or switching to clopidogrel  given her known carotid disease.  2.  Essential hypertension: Blood pressure is elevated.  Continue losartan  and amlodipine .  She is not on a beta-blocker due to bradycardia.  Will consider adding spironolactone if blood pressure remains elevated.  3.  Carotid artery disease: Followed by vascular surgery.  4.  Hyperlipidemia: Currently on atorvastatin  80 mg once daily.  I reviewed most recent labs done in November which showed an LDL of 36.  5.  Cardiac murmur: Most recent echo showed calcified aortic valve without significant stenosis with some basal septal hypertrophy.  Will consider repeat echocardiogram later this year.    Disposition:   FU with me in 6  months  Signed,  Deatrice Cage, MD  05/20/2024 3:38 PM    [facility] "

## 2024-05-20 NOTE — Patient Instructions (Signed)
 Medication Instructions:  No changes *If you need a refill on your cardiac medications before your next appointment, please call your pharmacy*  Lab Work: None ordered If you have labs (blood work) drawn today and your tests are completely normal, you will receive your results only by: MyChart Message (if you have MyChart) OR A paper copy in the mail If you have any lab test that is abnormal or we need to change your treatment, we will call you to review the results.  Testing/Procedures: None ordered  Follow-Up: At [facility], you and your health needs are our priority.  As part of our continuing mission to provide you with exceptional heart care, our providers are all part of one team.  This team includes your primary Cardiologist (physician) and Advanced Practice Providers or APPs (Physician Assistants and Nurse Practitioners) who all work together to provide you with the care you need, when you need it.  Your next appointment:   6 month(s)  Provider:   You may see Dr. Alvenia Aus or one of the following Advanced Practice Providers on your designated Care Team:   Laneta Pintos, NP Gildardo Labrador, PA-C Varney Gentleman, PA-C Cadence [place], PA-C Ronald Cockayne, NP Morey Ar, NP    We recommend signing up for the patient portal called MyChart.  Sign up information is provided on this After Visit Summary.  MyChart is used to connect with patients for Virtual Visits (Telemedicine).  Patients are able to view lab/test results, encounter notes, upcoming appointments, etc.  Non-urgent messages can be sent to your provider as well.   To learn more about what you can do with MyChart, go to [email or web address].

## 2024-07-10 ENCOUNTER — Encounter (INDEPENDENT_AMBULATORY_CARE_PROVIDER_SITE_OTHER)

## 2024-07-10 ENCOUNTER — Ambulatory Visit (INDEPENDENT_AMBULATORY_CARE_PROVIDER_SITE_OTHER): Admitting: Nurse Practitioner
